# Patient Record
Sex: Male | Born: 1990 | Race: White | Hispanic: No | Marital: Single | Smoking: Former smoker
Health system: Southern US, Community
[De-identification: ages and names within clinical notes are randomized; demographics above are authoritative.]

## PROBLEM LIST (undated history)

## (undated) DIAGNOSIS — B20 Human immunodeficiency virus [HIV] disease: Secondary | ICD-10-CM

## (undated) DIAGNOSIS — N529 Male erectile dysfunction, unspecified: Secondary | ICD-10-CM

## (undated) DIAGNOSIS — G4700 Insomnia, unspecified: Secondary | ICD-10-CM

## (undated) DIAGNOSIS — F32A Depression, unspecified: Secondary | ICD-10-CM

## (undated) HISTORY — DX: Insomnia, unspecified: G47.00

## (undated) HISTORY — DX: Human immunodeficiency virus (HIV) disease: B20

## (undated) HISTORY — DX: Male erectile dysfunction, unspecified: N52.9

## (undated) HISTORY — DX: Depression, unspecified: F32.A

---
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
## 2009-06-17 ENCOUNTER — Ambulatory Visit: Payer: Self-pay | Admitting: Psychiatry

## 2009-06-17 ENCOUNTER — Observation Stay (HOSPITAL_COMMUNITY): Admission: AD | Admit: 2009-06-17 | Discharge: 2009-06-19 | Payer: Self-pay | Admitting: Psychiatry

## 2009-06-17 ENCOUNTER — Emergency Department (HOSPITAL_COMMUNITY): Admission: EM | Admit: 2009-06-17 | Discharge: 2009-06-17 | Payer: Self-pay | Admitting: Emergency Medicine

## 2009-09-17 ENCOUNTER — Emergency Department (HOSPITAL_COMMUNITY): Admission: EM | Admit: 2009-09-17 | Discharge: 2009-09-17 | Payer: Self-pay | Admitting: Emergency Medicine

## 2010-06-16 ENCOUNTER — Emergency Department (HOSPITAL_COMMUNITY): Admission: EM | Admit: 2010-06-16 | Discharge: 2010-06-16 | Payer: Self-pay | Admitting: Emergency Medicine

## 2010-11-02 LAB — RAPID URINE DRUG SCREEN, HOSP PERFORMED
Amphetamines: NOT DETECTED
Barbiturates: NOT DETECTED
Benzodiazepines: NOT DETECTED
Cocaine: NOT DETECTED
Opiates: NOT DETECTED
Tetrahydrocannabinol: POSITIVE — AB

## 2010-11-02 LAB — CBC
HCT: 42.1 % (ref 39.0–52.0)
Platelets: 247 10*3/uL (ref 150–400)
WBC: 12.2 10*3/uL — ABNORMAL HIGH (ref 4.0–10.5)

## 2010-11-02 LAB — BASIC METABOLIC PANEL
BUN: 5 mg/dL — ABNORMAL LOW (ref 6–23)
Calcium: 9.2 mg/dL (ref 8.4–10.5)
GFR calc non Af Amer: >60 mL/min (ref >60)
Potassium: 3.7 mEq/L (ref 3.5–5.1)

## 2010-11-02 LAB — DIFFERENTIAL
Eosinophils Relative: 3 % (ref 0–5)
Lymphocytes Relative: 15 % (ref 12–46)
Lymphs Abs: 1.9 10*3/uL (ref 0.7–4.0)
Neutro Abs: 8.9 10*3/uL — ABNORMAL HIGH (ref 1.7–7.7)
Neutrophils Relative %: 73 % (ref 43–77)

## 2010-11-02 LAB — ETHANOL: Alcohol, Ethyl (B): <5 mg/dL (ref 0–10)
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
## 2013-11-16 ENCOUNTER — Encounter (HOSPITAL_COMMUNITY): Payer: Self-pay | Admitting: Emergency Medicine

## 2013-11-16 ENCOUNTER — Emergency Department (HOSPITAL_COMMUNITY)
Admission: EM | Admit: 2013-11-16 | Discharge: 2013-11-16 | Disposition: A | Discharge status: Home / Self Care | Payer: No Typology Code available for payment source | Attending: Emergency Medicine | Admitting: Emergency Medicine

## 2013-11-16 DIAGNOSIS — S0100XA Unspecified open wound of scalp, initial encounter: Secondary | ICD-10-CM | POA: Insufficient documentation

## 2013-11-16 DIAGNOSIS — S0101XA Laceration without foreign body of scalp, initial encounter: Secondary | ICD-10-CM

## 2013-11-16 DIAGNOSIS — F1010 Alcohol abuse, uncomplicated: Secondary | ICD-10-CM | POA: Insufficient documentation

## 2013-11-16 NOTE — ED Notes (Signed)
Patient head bandaged with sterile guaze wrap.
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
## 2013-11-16 NOTE — ED Notes (Signed)
Bed: RESA Expected date:  Expected time:  Means of arrival:  Comments: Patient coming with [employer]- head lac

## 2013-11-16 NOTE — Discharge Instructions (Signed)
The laceration has been repaired with derma bond and steri strip. The strip will fall off on it's own. Please read the information below on wound care.  Tissue Adhesive Wound Care Some cuts, wounds, lacerations, and incisions can be repaired by using tissue adhesive. Tissue adhesive is like glue. It holds the skin together, allowing for faster healing. It forms a strong bond on the skin in about 1 minute and reaches its full strength in about 2 or 3 minutes. The adhesive disappears naturally while the wound is healing. It is important to take proper care of your wound at home while it heals.  HOME CARE INSTRUCTIONS   Showers are allowed. Do not soak the area containing the tissue adhesive. Do not take baths, swim, or use hot tubs. Do not use any soaps or ointments on the wound. Certain ointments can weaken the glue.  If a bandage (dressing) has been applied, follow your health care provider's instructions for how often to change the dressing.   Keep the dressing dry if one has been applied.   Do not scratch, pick, or rub the adhesive.   Do not place tape over the adhesive. The adhesive could come off when pulling the tape off.   Protect the wound from further injury until it is healed.   Protect the wound from sun and tanning bed exposure while it is healing and for several weeks after healing.   Only take over-the-counter or prescription medicines as directed by your health care provider.   Keep all follow-up appointments as directed by your health care provider. SEEK IMMEDIATE MEDICAL CARE IF:   Your wound becomes red, swollen, hot, or tender.   You develop a rash after the glue is applied.  You have increasing pain in the wound.   You have a red streak that goes away from the wound.   You have pus coming from the wound.   You have increased bleeding.  You have a fever.  You have shaking chills.   You notice a bad smell coming from the wound.   Your wound  or adhesive breaks open.  MAKE SURE YOU:   Understand these instructions.  Will watch your condition.  Will get help right away if you are not doing well or get worse. Document Released: 01/10/2001 Document Revised: 05/07/2013 Document Reviewed: 02/05/2013 [facility] Patient Information 2014 [place], [place].
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
## 2013-11-16 NOTE — ED Notes (Signed)
Discharge instructions reviewed with [employer], requested them to provide these to nurse at jail upon arrival.

## 2019-02-15 ENCOUNTER — Other Ambulatory Visit: Payer: Self-pay | Admitting: *Deleted

## 2019-02-15 DIAGNOSIS — Z20822 Contact with and (suspected) exposure to covid-19: Secondary | ICD-10-CM

## 2019-02-19 LAB — NOVEL CORONAVIRUS, NAA: SARS-CoV-2, NAA: NOT DETECTED

## 2019-06-10 ENCOUNTER — Emergency Department
Admission: EM | Admit: 2019-06-10 | Discharge: 2019-06-10 | Discharge status: Absent without leave | Payer: Self-pay | Source: Home / Self Care

## 2019-06-10 ENCOUNTER — Other Ambulatory Visit: Payer: Self-pay

## 2020-03-21 ENCOUNTER — Emergency Department (HOSPITAL_COMMUNITY)
Admission: EM | Admit: 2020-03-21 | Discharge: 2020-03-21 | Disposition: A | Discharge status: Home / Self Care | Payer: 59 | Attending: Emergency Medicine | Admitting: Emergency Medicine

## 2020-03-21 ENCOUNTER — Other Ambulatory Visit: Payer: Self-pay

## 2020-03-21 ENCOUNTER — Encounter (HOSPITAL_COMMUNITY): Payer: Self-pay | Admitting: Emergency Medicine

## 2020-03-21 DIAGNOSIS — R05 Cough: Secondary | ICD-10-CM | POA: Diagnosis present

## 2020-03-21 DIAGNOSIS — Z20822 Contact with and (suspected) exposure to covid-19: Secondary | ICD-10-CM

## 2020-03-21 DIAGNOSIS — U071 COVID-19: Secondary | ICD-10-CM | POA: Insufficient documentation

## 2020-03-21 LAB — SARS CORONAVIRUS 2 BY RT PCR (HOSPITAL ORDER, PERFORMED IN ~~LOC~~ HOSPITAL LAB): SARS Coronavirus 2: POSITIVE — AB

## 2020-03-21 NOTE — ED Provider Notes (Signed)
[facility]-EMERGENCY DEPT Provider Note   CSN: [id number] Arrival date & time: 03/21/20  1809     History Chief Complaint  Patient presents with  . Covid positive  . wants housing referral    MABEL UNREIN is a 29 y.o. male.  HPI   Patient is a 29 year old male who has history of HIV disease but he is well controlled.  Viral count has been undetectable.  Patient states his most recent CD4 counts have been normal.  Patient has been compliant with medications.  Patient resides in a sober living house.  He states his roommate is on dialysis.  Patient started having some nasal congestion, slight cough and altered taste.  Patient has been vaccinated for Covid but he did a at home Covid test last evening.  Patient states it was positive.  He heard about the state housing program for patients with Covid.  He is requesting a housing referral.  History reviewed. No pertinent past medical history.  There are no problems to display for this patient.   History reviewed. No pertinent surgical history.     No family history on file.  Social History   Tobacco Use  . Smoking status: Never Smoker  Substance Use Topics  . Alcohol use: Yes  . Drug use: Yes    Types: Marijuana    Home Medications Prior to Admission medications   Medication Sig Start Date End Date Taking? Authorizing Provider  acetaminophen (TYLENOL) 500 MG tablet Take 500-1,000 mg by mouth every 6 (six) hours as needed (for headache.).    [provider]    Allergies    Patient has no known allergies.  Review of Systems   Review of Systems  All other systems reviewed and are negative.   Physical Exam Updated Vital Signs BP (!) 155/98 (BP Location: Left Arm)   Pulse 78   Temp 99.2 F (37.3 C) (Oral)   Resp 18   Ht 2.057 m (6\' 9" )   Wt 104.3 kg   SpO2 100%   BMI 24.65 kg/m   Physical Exam Vitals and nursing note reviewed.  Constitutional:      General: He is not in acute  distress.    Appearance: He is well-developed.  HENT:     Head: Normocephalic and atraumatic.     Right Ear: External ear normal.     Left Ear: External ear normal.  Eyes:     General: No scleral icterus.       Right eye: No discharge.        Left eye: No discharge.     Conjunctiva/sclera: Conjunctivae normal.  Neck:     Trachea: No tracheal deviation.  Cardiovascular:     Rate and Rhythm: Normal rate and regular rhythm.  Pulmonary:     Effort: Pulmonary effort is normal. No respiratory distress.     Breath sounds: Normal breath sounds. No stridor. No wheezing or rales.  Abdominal:     General: Bowel sounds are normal. There is no distension.     Palpations: Abdomen is soft.     Tenderness: There is no abdominal tenderness. There is no guarding or rebound.  Musculoskeletal:        General: No tenderness.     Cervical back: Neck supple.  Skin:    General: Skin is warm and dry.     Findings: No rash.  Neurological:     Mental Status: He is alert.     Cranial Nerves: No  cranial nerve deficit (no facial droop, extraocular movements intact, no slurred speech).     Sensory: No sensory deficit.     Motor: No abnormal muscle tone or seizure activity.     Coordination: Coordination normal.     ED Results / Procedures / Treatments   Labs (all labs ordered are listed, but only abnormal results are displayed) Labs Reviewed  SARS CORONAVIRUS 2 BY RT PCR (HOSPITAL ORDER, PERFORMED IN [facility])    EKG None  Radiology No results found.  Procedures Procedures (including critical care time)  Medications Ordered in ED Medications - No data to display  ED Course  I have reviewed the triage vital signs and the nursing notes.  Pertinent labs & imaging results that were available during my care of the patient were reviewed by me and considered in my medical decision making (see chart for details).    MDM Rules/Calculators/A&P                          Case  management was consulted when the patient was in the ED.  They were unable to assist with any housing this evening.  Recommended to isolate.  His COVID-19 test will be back later this evening.  I instructed him to check his mychart Final Clinical Impression(s) / ED Diagnoses Final diagnoses:  Suspected COVID-19 virus infection    Rx / DC Orders ED Discharge Orders    None       Linwood Dibbles, MD 03/21/20 2227

## 2020-03-21 NOTE — ED Triage Notes (Signed)
Pt reports that he lives in sober living home with others. Reports that took a home Covid test last night and was positive. Reports that here to get referral for Covid housing so can quarantine away from others esp with health issues.

## 2020-03-21 NOTE — Discharge Instructions (Signed)
I contacted the case manager tonight but they are unable to help with placement tonight.

## 2021-12-19 ENCOUNTER — Encounter: Payer: Self-pay | Admitting: Family

## 2021-12-19 ENCOUNTER — Ambulatory Visit: Payer: 59

## 2021-12-19 ENCOUNTER — Other Ambulatory Visit: Payer: Self-pay

## 2021-12-19 ENCOUNTER — Other Ambulatory Visit (HOSPITAL_COMMUNITY): Payer: Self-pay

## 2021-12-19 ENCOUNTER — Ambulatory Visit (INDEPENDENT_AMBULATORY_CARE_PROVIDER_SITE_OTHER): Payer: 59 | Admitting: Family

## 2021-12-19 ENCOUNTER — Ambulatory Visit (INDEPENDENT_AMBULATORY_CARE_PROVIDER_SITE_OTHER): Payer: 59 | Admitting: Pharmacist

## 2021-12-19 ENCOUNTER — Telehealth: Payer: Self-pay

## 2021-12-19 VITALS — BP 125/86 | HR 89 | Temp 97.8°F | Ht >= 80 in | Wt 293.0 lb

## 2021-12-19 DIAGNOSIS — Z23 Encounter for immunization: Secondary | ICD-10-CM | POA: Diagnosis not present

## 2021-12-19 DIAGNOSIS — G4709 Other insomnia: Secondary | ICD-10-CM | POA: Diagnosis not present

## 2021-12-19 DIAGNOSIS — Z0000 Encounter for general adult medical examination without abnormal findings: Secondary | ICD-10-CM

## 2021-12-19 DIAGNOSIS — B20 Human immunodeficiency virus [HIV] disease: Secondary | ICD-10-CM

## 2021-12-19 DIAGNOSIS — N529 Male erectile dysfunction, unspecified: Secondary | ICD-10-CM | POA: Diagnosis not present

## 2021-12-19 DIAGNOSIS — F3341 Major depressive disorder, recurrent, in partial remission: Secondary | ICD-10-CM | POA: Diagnosis not present

## 2021-12-19 DIAGNOSIS — Z113 Encounter for screening for infections with a predominantly sexual mode of transmission: Secondary | ICD-10-CM

## 2021-12-19 DIAGNOSIS — Z79899 Other long term (current) drug therapy: Secondary | ICD-10-CM

## 2021-12-19 MED ORDER — BICTEGRAVIR-EMTRICITAB-TENOFOV 50-200-25 MG PO TABS
1.0000 | ORAL_TABLET | Freq: Every day | ORAL | 6 refills | Status: DC
  Filled 2021-12-19 (×2): qty 30, 30d supply, fill #0
  Filled 2022-01-06: qty 30, 30d supply, fill #1
  Filled 2022-02-03: qty 30, 30d supply, fill #2
  Filled 2022-02-23: qty 30, 30d supply, fill #3
  Filled 2022-03-31: qty 30, 30d supply, fill #4

## 2021-12-19 MED ORDER — TRAZODONE HCL 100 MG PO TABS
ORAL_TABLET | ORAL | 5 refills | Status: DC
  Filled 2021-12-19: qty 30, 30d supply, fill #0
  Filled 2021-12-19: qty 30, fill #0
  Filled 2022-01-06: qty 30, 30d supply, fill #1
  Filled 2022-02-03: qty 30, 30d supply, fill #2
  Filled 2022-02-23: qty 30, 30d supply, fill #3
  Filled 2022-03-19: qty 30, 30d supply, fill #4
  Filled 2022-04-17: qty 30, 30d supply, fill #5

## 2021-12-19 MED ORDER — BUPROPION HCL ER (XL) 150 MG PO TB24
ORAL_TABLET | ORAL | 5 refills | Status: DC
  Filled 2021-12-19: qty 30, fill #0
  Filled 2021-12-19: qty 30, 30d supply, fill #0
  Filled 2022-01-06: qty 30, 30d supply, fill #1
  Filled 2022-01-23: qty 30, 30d supply, fill #2

## 2021-12-19 NOTE — Assessment & Plan Note (Signed)
Mr. [name] has chronic insomnia that is improved with nightly trazodone.  No adverse side effects.  Continue current dose of trazodone.

## 2021-12-19 NOTE — Assessment & Plan Note (Signed)
   Discussed importance of safe sexual practices and condom use.  Condoms offered.  Menveo updated.  Recommended routine dental care and can refer to [facility] if needed.

## 2021-12-19 NOTE — Assessment & Plan Note (Signed)
Mr. [name] is a 31 year old Caucasian male with HIV disease diagnosed in May 2021 with initial viral load of 668,000 and CD4 count of 27.  Previous history of PCP pneumonia.  Has been well controlled on Biktarvy and continues to take his medication with good adherence and tolerance.  Reviewed previous lab work and discussed plan of care.  Check blood work today.  Continue current dose of Biktarvy.  Plan for follow-up in 4 months or sooner if needed with lab work 1 to 2 weeks prior to appointment.

## 2021-12-19 NOTE — Progress Notes (Signed)
Brief Narrative   Patient ID: MILON DETHLOFF, male    DOB: 07-20-1991, 31 y.o.   MRN: [id number]  Mr. Hilligoss is a 31 y/o caucasian male diagnosed with HIV diease in May 2021 with risk factor of heterosexual contact. Initial CD4 count of 27 and viral load of 668,000. Initial Genotype with K103N (R-Efavirenz and neviripine). Entered care at [facility] Stage 3. OI history of PCP at diagnosis. Sole medication regimen of Biktarvy.   Subjective:    Chief Complaint  Patient presents with   New Patient (Initial Visit)    Transferring [facility]   HIV Positive/AIDS    HPI:  DARIN ARNDT is a 31 y.o. male with HIV disease, insomnia, and erectile dysfunction presenting today to transfer care for HIV disease from [facility].  Mr. Huntsberry was initially diagnosed with HIV disease in May 2021 when admitted to the hospital and found to have PCP. Initial viral load was 668,000 and CD4 count 27. Risk factor is heterosexual contact. Genotype with K103N medication resistance. Started on [place] for ART treatment. Mr. Mah was last seen at [facility] on 06/01/21 with well controlled virus and good adherence and tolerance to his Biktarvy. Viral load was 60 with CD4 count 521. Kidney function, liver function and electrolytes within normal ranges.  Mr. Dahlstrom continues to take his Biktarvy daily as prescribed with no adverse side effects. Feeling well today. Continues with same male partner who is HIV negative and on PrEP. Denies fevers, chills, night sweats, headaches, changes in vision, neck pain/stiffness, nausea, diarrhea, vomiting, lesions or rashes.  Mr. Maya has no problems obtaining medication from the pharmacy and now covered by [employer]. Has had issues with depression in the past which has been adequately managed with Wellbutrin which he is not currently taking. Has also been taking Trazodone which helps him to sleep. Previously prescribed sildenafil for erectile dysfunction which he takes as needed.  Healthcare maintenance due includes Menveo and Prevnar 20.    No Known Allergies    Outpatient Medications Prior to Visit  Medication Sig Dispense Refill   acetaminophen (TYLENOL) 500 MG tablet Take 500-1,000 mg by mouth every 6 (six) hours as needed (for headache.).     sildenafil (VIAGRA) 100 MG tablet Take one tablet (100 mg dose) by mouth as needed for Erectile Dysfunction. May start out at 1/2 tab as needed 30-60 min before sexual activity.     albuterol (VENTOLIN HFA) 108 (90 Base) MCG/ACT inhaler Inhale 2 puffs every 6 (six) hours if needed for wheezing.     bictegravir-emtricitabine-tenofovir AF (BIKTARVY) 50-200-25 MG TABS tablet Take 1 tablet by mouth daily.     buPROPion (WELLBUTRIN XL) 150 MG 24 hr tablet Take one tablet (150 mg dose) by mouth every morning.     cetirizine (ZYRTEC) 10 MG tablet Take 1 tablet (10 mg total) by mouth 1 (one) time each day.     traZODone (DESYREL) 100 MG tablet Take one tablet (100 mg dose) by mouth at bedtime.     valACYclovir (VALTREX) 1000 MG tablet 1 tab po bid X 3 days     No facility-administered medications prior to visit.     Past Medical History:  Diagnosis Date   Depression    Erectile dysfunction    HIV disease ([place])    Insomnia      History reviewed. No pertinent surgical history.    Review of Systems  Constitutional:  Negative for appetite change, chills, fatigue, fever and unexpected  weight change.  Eyes:  Negative for visual disturbance.  Respiratory:  Negative for cough, chest tightness, shortness of breath and wheezing.   Cardiovascular:  Negative for chest pain and leg swelling.  Gastrointestinal:  Negative for abdominal pain, constipation, diarrhea, nausea and vomiting.  Genitourinary:  Negative for dysuria, flank pain, frequency, genital sores, hematuria and urgency.  Skin:  Negative for rash.  Allergic/Immunologic: Negative for immunocompromised state.  Neurological:  Negative for dizziness and headaches.      Objective:    BP 125/86   Pulse 89   Temp 97.8 F (36.6 C) (Oral)   Ht 6' 10"  (2.083 m)   Wt 293 lb (132.9 kg)   BMI 30.64 kg/m  Nursing note and vital signs reviewed.  Physical Exam Constitutional:      General: He is not in acute distress.    Appearance: He is well-developed.  Eyes:     Conjunctiva/sclera: Conjunctivae normal.  Cardiovascular:     Rate and Rhythm: Normal rate and regular rhythm.     Heart sounds: Normal heart sounds. No murmur heard.   No friction rub. No gallop.  Pulmonary:     Effort: Pulmonary effort is normal. No respiratory distress.     Breath sounds: Normal breath sounds. No wheezing or rales.  Chest:     Chest wall: No tenderness.  Abdominal:     General: Bowel sounds are normal.     Palpations: Abdomen is soft.     Tenderness: There is no abdominal tenderness.  Musculoskeletal:     Cervical back: Neck supple.  Lymphadenopathy:     Cervical: No cervical adenopathy.  Skin:    General: Skin is warm and dry.     Findings: No rash.  Neurological:     Mental Status: He is alert and oriented to person, place, and time.  Psychiatric:        Behavior: Behavior normal.        Thought Content: Thought content normal.        Judgment: Judgment normal.        12/19/2021    9:15 AM  Depression screen PHQ 2/9  Decreased Interest 0  Down, Depressed, Hopeless 0  PHQ - 2 Score 0       Assessment & Plan:    Patient Active Problem List   Diagnosis Date Noted   HIV disease ([place]) 12/19/2021   Other insomnia 12/19/2021   Recurrent major depressive disorder, in partial remission ([place]) 12/19/2021   Erectile dysfunction 12/19/2021   Healthcare maintenance 12/19/2021     Problem List Items Addressed This Visit       Other   HIV disease ([place]) - Primary    Mr. Darl Householder is a 31 year old Caucasian male with HIV disease diagnosed in May 2021 with initial viral load of 668,000 and CD4 count of 27.  Previous history of PCP pneumonia.  Has been well  controlled on Biktarvy and continues to take his medication with good adherence and tolerance.  Reviewed previous lab work and discussed plan of care.  Check blood work today.  Continue current dose of Biktarvy.  Plan for follow-up in 4 months or sooner if needed with lab work 1 to 2 weeks prior to appointment.       Relevant Medications   bictegravir-emtricitabine-tenofovir AF (BIKTARVY) 50-200-25 MG TABS tablet   Other Relevant Orders   Comprehensive metabolic panel   CBC with Differential/Platelet   HIV-1 RNA quant-no reflex-bld   T-helper cell (CD4)- ([facility] clinic only)  HLA B*5701   QuantiFERON-TB Gold Plus   HIV-1 RNA quant-no reflex-bld   T-helper cell (CD4)- ([facility] clinic only)   HLA B*5701   Other insomnia    Mr. Darl Householder has chronic insomnia that is improved with nightly trazodone.  No adverse side effects.  Continue current dose of trazodone.       Relevant Medications   traZODone (DESYREL) 100 MG tablet   Recurrent major depressive disorder, in partial remission ([place])    Previously on Wellbutrin for depression with symptoms currently in partial remission and no suicidal ideations or signs of psychosis.  Counseling discussed.  Continue current dose of Wellbutrin.       Relevant Medications   traZODone (DESYREL) 100 MG tablet   buPROPion (WELLBUTRIN XL) 150 MG 24 hr tablet   Erectile dysfunction    Mr. Darl Householder continues to take sildenafil for erectile dysfunction as needed.  No adverse side effects.  Reminded not to take medication with nitrates for chest pain and to seek medical attention for erection lasting longer than 4 hours.  Continue to monitor.       Healthcare maintenance    Discussed importance of safe sexual practices and condom use.  Condoms offered. Menveo updated. Recommended routine dental care and can refer to [facility] if needed.       Other Visit Diagnoses     Pharmacologic therapy       Relevant Orders   Lipid Profile   Need for pneumococcal 20-valent  conjugate vaccination       Relevant Orders   Pneumococcal conjugate vaccine 20-valent (HMCNOBS-96) (Completed)   Screening for STDs (sexually transmitted diseases)       Relevant Orders   RPR        I have discontinued Jaylin G. Sugarman's albuterol, cetirizine, and valACYclovir. I am also having him maintain his acetaminophen, sildenafil, bictegravir-emtricitabine-tenofovir AF, traZODone, and buPROPion.   Meds ordered this encounter  Medications   bictegravir-emtricitabine-tenofovir AF (BIKTARVY) 50-200-25 MG TABS tablet    Sig: Take 1 tablet by mouth daily.    Dispense:  30 tablet    Refill:  6    Order Specific Question:   Supervising Provider    Answer:   Baxter Flattery, CYNTHIA [[id number]]   traZODone (DESYREL) 100 MG tablet    Sig: Take one tablet (100 mg dose) by mouth at bedtime.    Dispense:  30 tablet    Refill:  5    Order Specific Question:   Supervising Provider    Answer:   Baxter Flattery, CYNTHIA [[id number]]   buPROPion (WELLBUTRIN XL) 150 MG 24 hr tablet    Sig: Take one tablet (150 mg dose) by mouth every morning.    Dispense:  30 tablet    Refill:  5    Order Specific Question:   Supervising Provider    Answer:   Carlyle Basques [[id number]]     Follow-up: Return in about 4 months (around 04/21/2022), or if symptoms worsen or fail to improve.   Terri Piedra, MSN, FNP-C Nurse Practitioner [facility] for Infectious Disease [place] number: [phone]

## 2021-12-19 NOTE — Progress Notes (Signed)
Met with patient to discuss clinic pharmacy services. He is taking Biktarvy without any problems or issues. He has 4 pills left and will pick up his refill today from [facility]. No other questions or issues. Will call if he ever needs anything.  Anvita Hirata L. Eber Hong, PharmD, BCIDP, AAHIVP, CPP Clinical Pharmacist Practitioner Infectious Diseases [place] for Infectious Disease 12/19/2021, 11:09 AM

## 2021-12-19 NOTE — Telephone Encounter (Addendum)
[facility] Patient Product/process development scientist completed.    The patient is insured through [employer].  Biktarvy $073.71 patient will need a copay card  [place] $0.00   Biktarvy copay card     We will continue to follow to see if copay assistance is needed.  Clearance Coots, CPhT Specialty Pharmacy Patient [facility] for Infectious Disease Phone: [phone] Fax:  [phone]

## 2021-12-19 NOTE — Assessment & Plan Note (Signed)
Mr. [name] continues to take sildenafil for erectile dysfunction as needed.  No adverse side effects.  Reminded not to take medication with nitrates for chest pain and to seek medical attention for erection lasting longer than 4 hours.  Continue to monitor.

## 2021-12-19 NOTE — Assessment & Plan Note (Signed)
Previously on Wellbutrin for depression with symptoms currently in partial remission and no suicidal ideations or signs of psychosis.  Counseling discussed.  Continue current dose of Wellbutrin.

## 2021-12-19 NOTE — Patient Instructions (Addendum)
Nice to see you.  We will check your lab work today.  Continue to take your medication daily as prescribed.  Refills have been sent to the pharmacy.  Plan for follow up in 4 months or sooner if needed with lab work 1-2 weeks prior to appointment.    Have a great day and stay safe!  

## 2021-12-20 LAB — T-HELPER CELL (CD4) - (RCID CLINIC ONLY)
CD4 % Helper T Cell: 22 % — ABNORMAL LOW (ref 33–65)
CD4 T Cell Abs: 472 /uL (ref 400–1790)

## 2021-12-23 LAB — LIPID PANEL
Cholesterol: 179 mg/dL (ref <200)
HDL: 37 mg/dL — ABNORMAL LOW (ref >=40)
LDL Cholesterol (Calc): 97 mg/dL (calc)
Non-HDL Cholesterol (Calc): 142 mg/dL (calc) — ABNORMAL HIGH (ref <130)
Total CHOL/HDL Ratio: 4.8 (calc) (ref <5.0)
Triglycerides: 337 mg/dL — ABNORMAL HIGH (ref <150)

## 2021-12-23 LAB — COMPREHENSIVE METABOLIC PANEL
AG Ratio: 1.5 (calc) (ref 1.0–2.5)
ALT: 25 U/L (ref 9–46)
AST: 15 U/L (ref 10–40)
Albumin: 4.5 g/dL (ref 3.6–5.1)
Alkaline phosphatase (APISO): 78 U/L (ref 36–130)
BUN: 14 mg/dL (ref 7–25)
CO2: 25 mmol/L (ref 20–32)
Calcium: 9.9 mg/dL (ref 8.6–10.3)
Chloride: 106 mmol/L (ref 98–110)
Creat: 0.97 mg/dL (ref 0.60–1.26)
Globulin: 3.1 g/dL (calc) (ref 1.9–3.7)
Glucose, Bld: 101 mg/dL — ABNORMAL HIGH (ref 65–99)
Potassium: 4.5 mmol/L (ref 3.5–5.3)
Sodium: 139 mmol/L (ref 135–146)
Total Bilirubin: 0.4 mg/dL (ref 0.2–1.2)
Total Protein: 7.6 g/dL (ref 6.1–8.1)

## 2021-12-23 LAB — CBC WITH DIFFERENTIAL/PLATELET
Absolute Monocytes: 667 cells/uL (ref 200–950)
Basophils Absolute: 71 cells/uL (ref 0–200)
Basophils Relative: 1 %
Eosinophils Absolute: 241 cells/uL (ref 15–500)
Eosinophils Relative: 3.4 %
HCT: 49.1 % (ref 38.5–50.0)
Hemoglobin: 16.7 g/dL (ref 13.2–17.1)
Lymphs Abs: 2116 cells/uL (ref 850–3900)
MCH: 31.2 pg (ref 27.0–33.0)
MCHC: 34 g/dL (ref 32.0–36.0)
MCV: 91.6 fL (ref 80.0–100.0)
MPV: 10.7 fL (ref 7.5–12.5)
Monocytes Relative: 9.4 %
Neutro Abs: 4004 cells/uL (ref 1500–7800)
Neutrophils Relative %: 56.4 %
Platelets: 322 10*3/uL (ref 140–400)
RBC: 5.36 10*6/uL (ref 4.20–5.80)
RDW: 13.2 % (ref 11.0–15.0)
Total Lymphocyte: 29.8 %
WBC: 7.1 10*3/uL (ref 3.8–10.8)

## 2021-12-23 LAB — QUANTIFERON-TB GOLD PLUS
Mitogen-NIL: >10 IU/mL
NIL: 0.05 IU/mL
QuantiFERON-TB Gold Plus: NEGATIVE
TB1-NIL: <0 IU/mL
TB2-NIL: 0 IU/mL

## 2021-12-23 LAB — HIV-1 RNA QUANT-NO REFLEX-BLD
HIV 1 RNA Quant: NOT DETECTED copies/mL
HIV-1 RNA Quant, Log: NOT DETECTED Log copies/mL

## 2022-01-02 ENCOUNTER — Encounter: Payer: Self-pay | Admitting: Family

## 2022-01-06 ENCOUNTER — Other Ambulatory Visit (HOSPITAL_COMMUNITY): Payer: Self-pay

## 2022-01-09 ENCOUNTER — Other Ambulatory Visit (HOSPITAL_COMMUNITY): Payer: Self-pay

## 2022-01-23 ENCOUNTER — Encounter (INDEPENDENT_AMBULATORY_CARE_PROVIDER_SITE_OTHER): Payer: 59

## 2022-01-23 ENCOUNTER — Other Ambulatory Visit (HOSPITAL_COMMUNITY): Payer: Self-pay

## 2022-01-23 DIAGNOSIS — Z79899 Other long term (current) drug therapy: Secondary | ICD-10-CM | POA: Diagnosis not present

## 2022-01-25 ENCOUNTER — Other Ambulatory Visit (HOSPITAL_COMMUNITY): Payer: Self-pay

## 2022-01-25 MED ORDER — ONDANSETRON 4 MG PO TBDP
4.0000 mg | ORAL_TABLET | Freq: Three times a day (TID) | ORAL | 0 refills | Status: DC | PRN
  Filled 2022-01-25: qty 20, 7d supply, fill #0

## 2022-02-03 ENCOUNTER — Other Ambulatory Visit (HOSPITAL_COMMUNITY): Payer: Self-pay

## 2022-02-14 ENCOUNTER — Other Ambulatory Visit (HOSPITAL_COMMUNITY): Payer: Self-pay

## 2022-02-21 ENCOUNTER — Other Ambulatory Visit (HOSPITAL_COMMUNITY): Payer: Self-pay

## 2022-02-21 MED ORDER — BUPROPION HCL ER (XL) 300 MG PO TB24
300.0000 mg | ORAL_TABLET | Freq: Every day | ORAL | 3 refills | Status: DC
  Filled 2022-02-21: qty 30, 30d supply, fill #0
  Filled 2022-03-19: qty 30, 30d supply, fill #1
  Filled 2022-04-18: qty 30, 30d supply, fill #2
  Filled 2022-06-01: qty 30, 30d supply, fill #3

## 2022-02-21 NOTE — Addendum Note (Signed)
Addended by: Jeanine Luz D on: 02/21/2022 02:40 PM   Modules accepted: Orders

## 2022-02-23 ENCOUNTER — Other Ambulatory Visit (HOSPITAL_COMMUNITY): Payer: Self-pay

## 2022-02-28 ENCOUNTER — Other Ambulatory Visit (HOSPITAL_COMMUNITY): Payer: Self-pay

## 2022-03-01 ENCOUNTER — Other Ambulatory Visit (HOSPITAL_COMMUNITY): Payer: Self-pay

## 2022-03-01 MED ORDER — ALBUTEROL SULFATE HFA 108 (90 BASE) MCG/ACT IN AERS
2.0000 | INHALATION_SPRAY | Freq: Four times a day (QID) | RESPIRATORY_TRACT | 1 refills | Status: DC | PRN
  Filled 2022-03-01: qty 18, 25d supply, fill #0

## 2022-03-01 NOTE — Addendum Note (Signed)
Addended by: Jeanine Luz D on: 03/01/2022 02:00 PM   Modules accepted: Orders

## 2022-03-01 NOTE — Telephone Encounter (Signed)
Please see the MyChart message reply(ies) for my assessment and plan.    This patient gave consent for this Medical Advice Message and is aware that it may result in a bill to [employer], as well as the possibility of receiving a bill for a co-payment or deductible. They are an established patient, but are not seeking medical advice exclusively about a problem treated during an in person or video visit in the last seven days. I did not recommend an in person or video visit within seven days of my reply.    I spent a total of 8 minutes cumulative time within 7 days through [employer].  Jeanine Luz, FNP

## 2022-03-03 ENCOUNTER — Other Ambulatory Visit (HOSPITAL_BASED_OUTPATIENT_CLINIC_OR_DEPARTMENT_OTHER): Payer: Self-pay

## 2022-03-19 ENCOUNTER — Other Ambulatory Visit: Payer: Self-pay | Admitting: Family

## 2022-03-20 ENCOUNTER — Other Ambulatory Visit (HOSPITAL_COMMUNITY): Payer: Self-pay

## 2022-03-20 MED ORDER — ONDANSETRON 4 MG PO TBDP
4.0000 mg | ORAL_TABLET | Freq: Three times a day (TID) | ORAL | 0 refills | Status: DC | PRN
  Filled 2022-03-20: qty 20, 7d supply, fill #0

## 2022-03-20 NOTE — Telephone Encounter (Signed)
MyChart message sent to see if patient still needs.   Sandie Ano, RN

## 2022-03-21 ENCOUNTER — Other Ambulatory Visit (HOSPITAL_COMMUNITY): Payer: Self-pay

## 2022-03-22 ENCOUNTER — Other Ambulatory Visit (HOSPITAL_COMMUNITY): Payer: Self-pay

## 2022-03-24 ENCOUNTER — Other Ambulatory Visit (HOSPITAL_COMMUNITY): Payer: Self-pay

## 2022-03-31 ENCOUNTER — Other Ambulatory Visit (HOSPITAL_COMMUNITY): Payer: Self-pay

## 2022-04-04 ENCOUNTER — Other Ambulatory Visit (HOSPITAL_COMMUNITY): Payer: Self-pay

## 2022-04-11 ENCOUNTER — Ambulatory Visit: Payer: Self-pay | Admitting: Family

## 2022-04-11 NOTE — Progress Notes (Deleted)
    Brief Narrative   Patient ID: [name], male    DOB: 01/21/1991, 31 y.o.   MRN: [id number]  [name] is a 31 y/o caucasian male diagnosed with HIV diease in May 2021 with risk factor of heterosexual contact. Initial CD4 count of 27 and viral load of 668,000. Initial Genotype with K103N (R-Efavirenz and neviripine). Entered care at [facility] Stage 3. OI history of PCP at diagnosis. Sole medication regimen of Biktarvy.   Subjective:    No chief complaint on file.   HPI:  [name] is a 31 y.o. male with HIV disease last seen on 12/19/2021 with well-controlled virus and good adherence and tolerance to [employer].  Viral load was undetectable with CD4 count of 472.  Kidney function, liver function, electrolytes within normal ranges.  Lipid profile with triglycerides 337, LDL 97, and HDL 37.  Here today for routine follow-up.     No Known Allergies    Outpatient Medications Prior to Visit  Medication Sig Dispense Refill   acetaminophen (TYLENOL) 500 MG tablet Take 500-1,000 mg by mouth every 6 (six) hours as needed (for headache.).     albuterol (VENTOLIN HFA) 108 (90 Base) MCG/ACT inhaler Inhale 2 puffs into the lungs every 6 (six) hours as needed for wheezing or shortness of breath. 18 g 1   bictegravir-emtricitabine-tenofovir AF (BIKTARVY) 50-200-25 MG TABS tablet Take 1 tablet by mouth daily. 30 tablet 6   buPROPion (WELLBUTRIN XL) 300 MG 24 hr tablet Take 1 tablet by mouth daily. 30 tablet 3   ondansetron (ZOFRAN-ODT) 4 MG disintegrating tablet Dissolve 1 tablet by mouth every 8 hours as needed for nausea or vomiting. 20 tablet 0   sildenafil (VIAGRA) 100 MG tablet Take one tablet (100 mg dose) by mouth as needed for Erectile Dysfunction. May start out at 1/2 tab as needed 30-60 min before sexual activity.     traZODone (DESYREL) 100 MG tablet Take one tablet (100 mg dose) by mouth at bedtime. 30 tablet 5   No facility-administered medications prior to visit.     Past Medical  History:  Diagnosis Date   Depression    Erectile dysfunction    HIV disease (HCC)    Insomnia      No past surgical history on file.    Review of Systems    Objective:    There were no vitals taken for this visit. Nursing note and vital signs reviewed.  Physical Exam      12/19/2021    9:15 AM  Depression screen PHQ 2/9  Decreased Interest 0  Down, Depressed, Hopeless 0  PHQ - 2 Score 0       Assessment & Plan:    Patient Active Problem List   Diagnosis Date Noted   HIV disease (HCC) 12/19/2021   Other insomnia 12/19/2021   Recurrent major depressive disorder, in partial remission (HCC) 12/19/2021   Erectile dysfunction 12/19/2021   Healthcare maintenance 12/19/2021     Problem List Items Addressed This Visit   None    I am having [name] maintain his acetaminophen, sildenafil, bictegravir-emtricitabine-tenofovir AF, traZODone, buPROPion, albuterol, and ondansetron.   No orders of the defined types were placed in this encounter.    Follow-up: No follow-ups on file.   Marcos Eke, MSN, FNP-C Nurse Practitioner [facility] for Infectious Disease [facility] Main number: [phone]

## 2022-04-17 ENCOUNTER — Other Ambulatory Visit (HOSPITAL_COMMUNITY): Payer: Self-pay

## 2022-04-18 ENCOUNTER — Other Ambulatory Visit (HOSPITAL_COMMUNITY): Payer: Self-pay

## 2022-04-19 ENCOUNTER — Other Ambulatory Visit (HOSPITAL_COMMUNITY): Payer: Self-pay

## 2022-04-28 NOTE — Progress Notes (Deleted)
Brief Narrative   Patient ID: [name], male    DOB: 18-Feb-1991, 31 y.o.   MRN: [id number]  Mr. [name] is a 31 y/o caucasian male diagnosed with HIV diease in May 2021 with risk factor of heterosexual contact. Initial CD4 count of 27 and viral load of 668,000. Initial Genotype with K103N (R-Efavirenz and neviripine). Entered care at [facility] Stage 3. OI history of PCP at diagnosis. Sole medication regimen of Biktarvy.   Subjective:    No chief complaint on file.   HPI:  [name] is a 31 y.o. male with HIV/AIDS last seen on 12/19/2021 with well-controlled virus and good adherence and tolerance to [employer].  Viral load was undetectable with CD4 count of 472.  Kidney function, liver function, electrolytes within normal ranges.  Lipid profile with triglycerides 337, LDL 97, and HDL 37.  Here today for follow-up.   No Known Allergies    Outpatient Medications Prior to Visit  Medication Sig Dispense Refill   acetaminophen (TYLENOL) 500 MG tablet Take 500-1,000 mg by mouth every 6 (six) hours as needed (for headache.).     albuterol (VENTOLIN HFA) 108 (90 Base) MCG/ACT inhaler Inhale 2 puffs into the lungs every 6 (six) hours as needed for wheezing or shortness of breath. 18 g 1   bictegravir-emtricitabine-tenofovir AF (BIKTARVY) 50-200-25 MG TABS tablet Take 1 tablet by mouth daily. 30 tablet 6   buPROPion (WELLBUTRIN XL) 300 MG 24 hr tablet Take 1 tablet by mouth daily. 30 tablet 3   ondansetron (ZOFRAN-ODT) 4 MG disintegrating tablet Dissolve 1 tablet by mouth every 8 hours as needed for nausea or vomiting. 20 tablet 0   sildenafil (VIAGRA) 100 MG tablet Take one tablet (100 mg dose) by mouth as needed for Erectile Dysfunction. May start out at 1/2 tab as needed 30-60 min before sexual activity.     traZODone (DESYREL) 100 MG tablet Take one tablet (100 mg dose) by mouth at bedtime. 30 tablet 5   No facility-administered medications prior to visit.     Past Medical History:  Diagnosis  Date   Depression    Erectile dysfunction    HIV disease (HCC)    Insomnia      No past surgical history on file.    Review of Systems  Constitutional:  Negative for appetite change, chills, fatigue, fever and unexpected weight change.  Eyes:  Negative for visual disturbance.  Respiratory:  Negative for cough, chest tightness, shortness of breath and wheezing.   Cardiovascular:  Negative for chest pain and leg swelling.  Gastrointestinal:  Negative for abdominal pain, constipation, diarrhea, nausea and vomiting.  Genitourinary:  Negative for dysuria, flank pain, frequency, genital sores, hematuria and urgency.  Skin:  Negative for rash.  Allergic/Immunologic: Negative for immunocompromised state.  Neurological:  Negative for dizziness and headaches.      Objective:    There were no vitals taken for this visit. Nursing note and vital signs reviewed.  Physical Exam Constitutional:      General: He is not in acute distress.    Appearance: He is well-developed.  Eyes:     Conjunctiva/sclera: Conjunctivae normal.  Cardiovascular:     Rate and Rhythm: Normal rate and regular rhythm.     Heart sounds: Normal heart sounds. No murmur heard.    No friction rub. No gallop.  Pulmonary:     Effort: Pulmonary effort is normal. No respiratory distress.     Breath sounds: Normal breath sounds. No wheezing  or rales.  Chest:     Chest wall: No tenderness.  Abdominal:     General: Bowel sounds are normal.     Palpations: Abdomen is soft.     Tenderness: There is no abdominal tenderness.  Musculoskeletal:     Cervical back: Neck supple.  Lymphadenopathy:     Cervical: No cervical adenopathy.  Skin:    General: Skin is warm and dry.     Findings: No rash.  Neurological:     Mental Status: He is alert and oriented to person, place, and time.  Psychiatric:        Behavior: Behavior normal.        Thought Content: Thought content normal.        Judgment: Judgment normal.          12/19/2021    9:15 AM  Depression screen PHQ 2/9  Decreased Interest 0  Down, Depressed, Hopeless 0  PHQ - 2 Score 0       Assessment & Plan:    Patient Active Problem List   Diagnosis Date Noted   HIV disease ([place]) 12/19/2021   Other insomnia 12/19/2021   Recurrent major depressive disorder, in partial remission ([place]) 12/19/2021   Erectile dysfunction 12/19/2021   Healthcare maintenance 12/19/2021     Problem List Items Addressed This Visit   None    I am having [name] maintain his acetaminophen, sildenafil, bictegravir-emtricitabine-tenofovir AF, traZODone, buPROPion, albuterol, and ondansetron.   No orders of the defined types were placed in this encounter.    Follow-up: No follow-ups on file.   Terri Piedra, MSN, FNP-C Nurse Practitioner [facility] [place] number: [phone]

## 2022-05-01 ENCOUNTER — Ambulatory Visit: Payer: Self-pay | Admitting: Family

## 2022-05-04 ENCOUNTER — Encounter: Payer: Self-pay | Admitting: Family

## 2022-05-04 ENCOUNTER — Other Ambulatory Visit (HOSPITAL_COMMUNITY): Payer: Self-pay

## 2022-05-04 ENCOUNTER — Ambulatory Visit (INDEPENDENT_AMBULATORY_CARE_PROVIDER_SITE_OTHER): Payer: Commercial Managed Care - HMO | Admitting: Family

## 2022-05-04 ENCOUNTER — Other Ambulatory Visit: Payer: Self-pay

## 2022-05-04 VITALS — BP 123/81 | HR 103 | Temp 97.5°F | Ht >= 80 in | Wt 277.0 lb

## 2022-05-04 DIAGNOSIS — B20 Human immunodeficiency virus [HIV] disease: Secondary | ICD-10-CM | POA: Diagnosis not present

## 2022-05-04 DIAGNOSIS — Z0000 Encounter for general adult medical examination without abnormal findings: Secondary | ICD-10-CM | POA: Diagnosis not present

## 2022-05-04 DIAGNOSIS — G629 Polyneuropathy, unspecified: Secondary | ICD-10-CM | POA: Diagnosis not present

## 2022-05-04 DIAGNOSIS — Z23 Encounter for immunization: Secondary | ICD-10-CM | POA: Diagnosis not present

## 2022-05-04 DIAGNOSIS — N529 Male erectile dysfunction, unspecified: Secondary | ICD-10-CM | POA: Diagnosis not present

## 2022-05-04 MED ORDER — BICTEGRAVIR-EMTRICITAB-TENOFOV 50-200-25 MG PO TABS
1.0000 | ORAL_TABLET | Freq: Every day | ORAL | 6 refills | Status: DC
  Filled 2022-05-04 – 2022-05-22 (×4): qty 30, 30d supply, fill #0

## 2022-05-04 MED ORDER — SILDENAFIL CITRATE 100 MG PO TABS: ORAL_TABLET | ORAL | 1 refills | Status: DC

## 2022-05-04 NOTE — Patient Instructions (Addendum)
Nice to see you.  We will check your lab work today.  Continue to take your medication daily as prescribed.  Refills have been sent to the pharmacy.  Plan for follow up in 6 months or sooner if needed with lab work on the same day.  Have a great day and stay safe!  Supplements that may help with neuropathy alpha-lipoic acid and benfotiamine.

## 2022-05-04 NOTE — Progress Notes (Signed)
Brief Narrative   Patient ID: [name], male    DOB: 06-13-1991, 31 y.o.   MRN: [id number]  [name] is a 31 y/o caucasian male diagnosed with HIV diease in May 2021 with risk factor of heterosexual contact. Initial CD4 count of 27 and viral load of 668,000. Initial Genotype with K103N (R-Efavirenz and neviripine). Entered care at [facility] Stage 3. OI history of PCP at diagnosis. Sole medication regimen of Biktarvy.   Subjective:    Chief Complaint  Patient presents with   Follow-up    HPI:  [name] is a 31 y.o. male with HIV disease last seen on 12/19/21 with well controlled virus and good adherence and tolerance to Biktarvy. Viral load was undetectable and CD4 count 472. Quantiferon Gold negative. Kidney function, liver function and electrolytes within normal ranges. Lipid profile with triglycerides 337, LDL 97 and HDL 37. Here today for follow up.   [name] has been doing well since his last office visit and is currently working as a Freight forwarder at [employer].  Has had some numbness/tingling of his bilateral feet on occasion that started since his diagnosis.  Has been taking Biktarvy as prescribed with no adverse side effects.  Using sildenafil for erectile dysfunction.  Feeling well today having lost some weight recently.  Condoms and STD testing offered.  Healthcare maintenance to include influenza vaccination and routine dental care.  Denies fevers, chills, night sweats, headaches, changes in vision, neck pain/stiffness, nausea, diarrhea, vomiting, lesions or rashes.   No Known Allergies    Outpatient Medications Prior to Visit  Medication Sig Dispense Refill   acetaminophen (TYLENOL) 500 MG tablet Take 500-1,000 mg by mouth every 6 (six) hours as needed (for headache.).     albuterol (VENTOLIN HFA) 108 (90 Base) MCG/ACT inhaler Inhale 2 puffs into the lungs every 6 (six) hours as needed for wheezing or shortness of breath. 18 g 1   buPROPion (WELLBUTRIN XL) 300 MG 24 hr tablet Take 1  tablet by mouth daily. 30 tablet 3   ondansetron (ZOFRAN-ODT) 4 MG disintegrating tablet Dissolve 1 tablet by mouth every 8 hours as needed for nausea or vomiting. 20 tablet 0   traZODone (DESYREL) 100 MG tablet Take one tablet (100 mg dose) by mouth at bedtime. 30 tablet 5   bictegravir-emtricitabine-tenofovir AF (BIKTARVY) 50-200-25 MG TABS tablet Take 1 tablet by mouth daily. 30 tablet 6   sildenafil (VIAGRA) 100 MG tablet Take one tablet (100 mg dose) by mouth as needed for Erectile Dysfunction. May start out at 1/2 tab as needed 30-60 min before sexual activity.     No facility-administered medications prior to visit.     Past Medical History:  Diagnosis Date   Depression    Erectile dysfunction    HIV disease ([place])    Insomnia      No past surgical history on file.    Review of Systems  Constitutional:  Negative for appetite change, chills, fatigue, fever and unexpected weight change.  Eyes:  Negative for visual disturbance.  Respiratory:  Negative for cough, chest tightness, shortness of breath and wheezing.   Cardiovascular:  Negative for chest pain and leg swelling.  Gastrointestinal:  Negative for abdominal pain, constipation, diarrhea, nausea and vomiting.  Genitourinary:  Negative for dysuria, flank pain, frequency, genital sores, hematuria and urgency.  Skin:  Negative for rash.  Allergic/Immunologic: Negative for immunocompromised state.  Neurological:  Negative for dizziness and headaches.      Objective:  BP 123/81   Pulse (!) 103   Temp (!) 97.5 F (36.4 C) (Oral)   Ht 6\' 9"  (2.057 m)   Wt 277 lb (125.6 kg)   SpO2 97%   BMI 29.68 kg/m  Nursing note and vital signs reviewed.  Physical Exam Constitutional:      General: He is not in acute distress.    Appearance: He is well-developed.  Eyes:     Conjunctiva/sclera: Conjunctivae normal.  Cardiovascular:     Rate and Rhythm: Normal rate and regular rhythm.     Heart sounds: Normal heart sounds.  No murmur heard.    No friction rub. No gallop.  Pulmonary:     Effort: Pulmonary effort is normal. No respiratory distress.     Breath sounds: Normal breath sounds. No wheezing or rales.  Chest:     Chest wall: No tenderness.  Abdominal:     General: Bowel sounds are normal.     Palpations: Abdomen is soft.     Tenderness: There is no abdominal tenderness.  Musculoskeletal:     Cervical back: Neck supple.  Lymphadenopathy:     Cervical: No cervical adenopathy.  Skin:    General: Skin is warm and dry.     Findings: No rash.  Neurological:     Mental Status: He is alert and oriented to person, place, and time.  Psychiatric:        Behavior: Behavior normal.        Thought Content: Thought content normal.        Judgment: Judgment normal.         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 Check A1c and B12.  Treat with alphalipoic acid and benfotiamine which are supplements available over the counter. Well suppressed with BIktarvy.       Relevant Orders   HgB A1c   B12     Other   HIV disease ([place]) - Primary    [name] continues to have well controlled virus and good adherence and tolerance to [employer]. Reviewed previous lab work and discussed plan of care. 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 Check  lab work today. Continue current dose of Biktarvy. Plan for follow up in 6 months or sooner if needed with lab work on the same day.       Relevant Medications   bictegravir-emtricitabine-tenofovir AF (BIKTARVY) 50-200-25 MG TABS tablet   Other Relevant Orders   Comprehensive metabolic panel   HIV-1 RNA quant-no reflex-bld   T-helper cell (CD4)- ([facility] clinic only)   Erectile dysfunction    [name] continues to use sildenafil as needed for erectile dysfunction with no adverse side effects. Reminded not to  take with nitrates for chest pain and seek further care for erection lasting 4 hours. Continue current dose of sildenafil as needed.       Healthcare maintenance    Discussed importance of safe sexual practice and condom use. Condoms and STD testing offered.  Influenza vaccine updated. Due for routine dental care - refer to dental clinic.         I am having [name] maintain his acetaminophen, traZODone, buPROPion, albuterol, ondansetron, bictegravir-emtricitabine-tenofovir AF, and sildenafil.   Meds ordered this encounter  Medications   bictegravir-emtricitabine-tenofovir AF (BIKTARVY) 50-200-25 MG TABS tablet    Sig: Take 1 tablet by mouth daily.    Dispense:  30 tablet    Refill:  6    Order Specific Question:   Supervising Provider    Answer:   Carlyle Basques [[id number]]   sildenafil (VIAGRA) 100 MG tablet    Sig: Take one tablet (100 mg dose) by mouth as needed for Erectile Dysfunction. May start out at 1/2 tab as needed 30-60 min before sexual activity.    Dispense:  30 tablet    Refill:  1    Order Specific Question:   Supervising Provider    Answer:   Carlyle Basques [[id number]]     Follow-up: Return in about 6 months (around 11/03/2022), or if symptoms worsen or fail to improve.   Terri Piedra, MSN, FNP-C Nurse Practitioner [facility] [place] number: [phone]

## 2022-05-04 NOTE — Assessment & Plan Note (Signed)
Zacary continues to use sildenafil as needed for erectile dysfunction with no adverse side effects. Reminded not to take with nitrates for chest pain and seek further care for erection lasting 4 hours. Continue current dose of sildenafil as needed.

## 2022-05-04 NOTE — Assessment & Plan Note (Signed)
   Discussed importance of safe sexual practice and condom use. Condoms and STD testing offered.   Influenza vaccine updated.  Due for routine dental care - refer to dental clinic.

## 2022-05-04 NOTE — Assessment & Plan Note (Signed)
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 Check A1c and B12.  Treat with alphalipoic acid and benfotiamine which are supplements available over the counter. Well suppressed with BIktarvy.

## 2022-05-04 NOTE — Assessment & Plan Note (Signed)
[name] continues to have well controlled virus and good adherence and tolerance to [employer]. Reviewed previous lab work and discussed plan of care. 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 Check lab work today. Continue current dose of Biktarvy. Plan for follow up in 6 months or sooner if needed with lab work on the same day.

## 2022-05-06 LAB — COMPREHENSIVE METABOLIC PANEL
AG Ratio: 1.5 (calc) (ref 1.0–2.5)
ALT: 23 U/L (ref 9–46)
AST: 17 U/L (ref 10–40)
Albumin: 4.4 g/dL (ref 3.6–5.1)
Alkaline phosphatase (APISO): 72 U/L (ref 36–130)
BUN: 20 mg/dL (ref 7–25)
CO2: 25 mmol/L (ref 20–32)
Calcium: 9.7 mg/dL (ref 8.6–10.3)
Chloride: 107 mmol/L (ref 98–110)
Creat: 1.19 mg/dL (ref 0.60–1.26)
Globulin: 2.9 g/dL (calc) (ref 1.9–3.7)
Glucose, Bld: 91 mg/dL (ref 65–99)
Potassium: 4.6 mmol/L (ref 3.5–5.3)
Sodium: 139 mmol/L (ref 135–146)
Total Bilirubin: 0.4 mg/dL (ref 0.2–1.2)
Total Protein: 7.3 g/dL (ref 6.1–8.1)

## 2022-05-06 LAB — VITAMIN B12: Vitamin B-12: 493 pg/mL (ref 200–1100)

## 2022-05-06 LAB — HEMOGLOBIN A1C
Hgb A1c MFr Bld: 5.1 % of total Hgb (ref <5.7)
Mean Plasma Glucose: 100 mg/dL
eAG (mmol/L): 5.5 mmol/L

## 2022-05-06 LAB — HIV-1 RNA QUANT-NO REFLEX-BLD
HIV 1 RNA Quant: <20 Copies/mL — ABNORMAL HIGH
HIV-1 RNA Quant, Log: <1.3 Log cps/mL — ABNORMAL HIGH

## 2022-05-09 ENCOUNTER — Other Ambulatory Visit (HOSPITAL_COMMUNITY): Payer: Self-pay

## 2022-05-11 ENCOUNTER — Other Ambulatory Visit (HOSPITAL_COMMUNITY): Payer: Self-pay

## 2022-05-15 ENCOUNTER — Other Ambulatory Visit (HOSPITAL_COMMUNITY): Payer: Self-pay

## 2022-05-16 ENCOUNTER — Encounter (INDEPENDENT_AMBULATORY_CARE_PROVIDER_SITE_OTHER): Payer: Commercial Managed Care - HMO

## 2022-05-16 DIAGNOSIS — F419 Anxiety disorder, unspecified: Secondary | ICD-10-CM

## 2022-05-17 ENCOUNTER — Other Ambulatory Visit (HOSPITAL_COMMUNITY): Payer: Self-pay

## 2022-05-17 MED ORDER — BUSPIRONE HCL 10 MG PO TABS
10.0000 mg | ORAL_TABLET | Freq: Two times a day (BID) | ORAL | 1 refills | Status: DC
  Filled 2022-05-17: qty 60, 30d supply, fill #0
  Filled 2022-07-12: qty 60, 30d supply, fill #1

## 2022-05-17 NOTE — Addendum Note (Signed)
Addended by: Mauricio Po D on: 05/17/2022 09:00 AM   Modules accepted: Orders

## 2022-05-17 NOTE — Telephone Encounter (Signed)
Please see the MyChart message reply(ies) for my assessment and plan.    This patient gave consent for this Medical Advice Message and is aware that it may result in a bill to [employer], as well as the possibility of receiving a bill for a co-payment or deductible. They are an established patient, but are not seeking medical advice exclusively about a problem treated during an in person or video visit in the last seven days. I did not recommend an in person or video visit within seven days of my reply.    I spent a total of 10 minutes cumulative time within 7 days through [employer].  Mauricio Po, FNP

## 2022-05-19 ENCOUNTER — Other Ambulatory Visit (HOSPITAL_COMMUNITY): Payer: Self-pay

## 2022-05-22 ENCOUNTER — Other Ambulatory Visit: Payer: Self-pay | Admitting: Family

## 2022-05-22 ENCOUNTER — Other Ambulatory Visit (HOSPITAL_COMMUNITY): Payer: Self-pay

## 2022-05-22 MED ORDER — DOVATO 50-300 MG PO TABS
1.0000 | ORAL_TABLET | Freq: Every day | ORAL | 5 refills | Status: DC
  Filled 2022-05-22 – 2022-05-23 (×2): qty 30, 30d supply, fill #0
  Filled 2022-06-12: qty 30, 30d supply, fill #1
  Filled 2022-07-12: qty 30, 30d supply, fill #2
  Filled 2022-08-18 (×2): qty 30, 30d supply, fill #3
  Filled 2022-09-15: qty 30, 30d supply, fill #4
  Filled 2022-10-13 – 2022-10-27 (×5): qty 30, 30d supply, fill #5

## 2022-05-22 NOTE — Progress Notes (Signed)
Received message that insurance coverage with copay care will no longer cover Biktarvy and will cover Triumeq and Dovato. Prescription sent for Dovato.

## 2022-05-22 NOTE — Telephone Encounter (Signed)
I spoke to him , he will come by tomorrow to get some samples

## 2022-05-22 NOTE — Telephone Encounter (Signed)
I called him this Am and could not leave a VM. I will need W2 to sign up for PAF.

## 2022-05-23 ENCOUNTER — Other Ambulatory Visit (HOSPITAL_COMMUNITY): Payer: Self-pay

## 2022-06-01 ENCOUNTER — Other Ambulatory Visit (HOSPITAL_COMMUNITY): Payer: Self-pay

## 2022-06-01 ENCOUNTER — Other Ambulatory Visit: Payer: Self-pay | Admitting: Family

## 2022-06-01 DIAGNOSIS — G4709 Other insomnia: Secondary | ICD-10-CM

## 2022-06-01 MED ORDER — BUPROPION HCL ER (XL) 300 MG PO TB24
300.0000 mg | ORAL_TABLET | Freq: Every day | ORAL | 0 refills | Status: DC
  Filled 2022-06-01: qty 90, 90d supply, fill #0

## 2022-06-01 MED ORDER — TRAZODONE HCL 100 MG PO TABS
ORAL_TABLET | ORAL | 2 refills | Status: DC
  Filled 2022-06-01: qty 30, 30d supply, fill #0
  Filled 2022-07-12: qty 30, 30d supply, fill #1
  Filled 2022-08-18: qty 30, 30d supply, fill #2

## 2022-06-02 ENCOUNTER — Other Ambulatory Visit (HOSPITAL_COMMUNITY): Payer: Self-pay

## 2022-06-12 ENCOUNTER — Other Ambulatory Visit (HOSPITAL_COMMUNITY): Payer: Self-pay

## 2022-06-15 ENCOUNTER — Other Ambulatory Visit (HOSPITAL_COMMUNITY): Payer: Self-pay

## 2022-07-11 ENCOUNTER — Other Ambulatory Visit (HOSPITAL_COMMUNITY): Payer: Self-pay

## 2022-07-13 ENCOUNTER — Other Ambulatory Visit: Payer: Self-pay

## 2022-07-13 ENCOUNTER — Other Ambulatory Visit (HOSPITAL_COMMUNITY): Payer: Self-pay

## 2022-07-13 MED ORDER — SILDENAFIL CITRATE 100 MG PO TABS
ORAL_TABLET | ORAL | 1 refills | Status: AC
  Filled 2022-07-13: qty 30, 30d supply, fill #0
  Filled 2022-07-27: qty 10, 30d supply, fill #0
  Filled 2022-07-28: qty 30, 30d supply, fill #0
  Filled 2022-07-28: qty 6, 30d supply, fill #0
  Filled 2022-09-06 – 2022-10-31 (×2): qty 30, 30d supply, fill #1

## 2022-07-25 ENCOUNTER — Other Ambulatory Visit (HOSPITAL_COMMUNITY): Payer: Self-pay

## 2022-07-27 ENCOUNTER — Other Ambulatory Visit (HOSPITAL_COMMUNITY): Payer: Self-pay

## 2022-07-28 ENCOUNTER — Other Ambulatory Visit: Payer: Self-pay

## 2022-07-28 ENCOUNTER — Other Ambulatory Visit (HOSPITAL_COMMUNITY): Payer: Self-pay

## 2022-08-08 ENCOUNTER — Other Ambulatory Visit (HOSPITAL_COMMUNITY): Payer: Self-pay

## 2022-08-10 ENCOUNTER — Other Ambulatory Visit (HOSPITAL_COMMUNITY): Payer: Self-pay

## 2022-08-14 ENCOUNTER — Other Ambulatory Visit: Payer: Self-pay

## 2022-08-18 ENCOUNTER — Other Ambulatory Visit (HOSPITAL_COMMUNITY): Payer: Self-pay

## 2022-09-06 ENCOUNTER — Other Ambulatory Visit: Payer: Self-pay | Admitting: Family

## 2022-09-06 ENCOUNTER — Other Ambulatory Visit (HOSPITAL_COMMUNITY): Payer: Self-pay

## 2022-09-06 ENCOUNTER — Other Ambulatory Visit: Payer: Self-pay

## 2022-09-06 MED ORDER — BUPROPION HCL ER (XL) 300 MG PO TB24
300.0000 mg | ORAL_TABLET | Freq: Every day | ORAL | 1 refills | Status: DC
  Filled 2022-09-06 – 2022-09-18 (×2): qty 90, 90d supply, fill #0
  Filled 2022-10-31: qty 90, 90d supply, fill #1

## 2022-09-06 NOTE — Telephone Encounter (Signed)
Ok to refill 

## 2022-09-07 ENCOUNTER — Other Ambulatory Visit (HOSPITAL_COMMUNITY): Payer: Self-pay

## 2022-09-07 ENCOUNTER — Other Ambulatory Visit: Payer: Self-pay

## 2022-09-13 ENCOUNTER — Other Ambulatory Visit (HOSPITAL_COMMUNITY): Payer: Self-pay

## 2022-09-15 ENCOUNTER — Other Ambulatory Visit (HOSPITAL_COMMUNITY): Payer: Self-pay

## 2022-09-16 ENCOUNTER — Other Ambulatory Visit (HOSPITAL_COMMUNITY): Payer: Self-pay

## 2022-09-18 ENCOUNTER — Other Ambulatory Visit (HOSPITAL_COMMUNITY): Payer: Self-pay

## 2022-09-19 ENCOUNTER — Other Ambulatory Visit (HOSPITAL_COMMUNITY): Payer: Self-pay

## 2022-10-11 ENCOUNTER — Other Ambulatory Visit (HOSPITAL_COMMUNITY): Payer: Self-pay

## 2022-10-13 ENCOUNTER — Other Ambulatory Visit: Payer: Self-pay | Admitting: Family

## 2022-10-13 ENCOUNTER — Other Ambulatory Visit: Payer: Self-pay

## 2022-10-13 ENCOUNTER — Other Ambulatory Visit (HOSPITAL_COMMUNITY): Payer: Self-pay

## 2022-10-13 DIAGNOSIS — G4709 Other insomnia: Secondary | ICD-10-CM

## 2022-10-24 ENCOUNTER — Other Ambulatory Visit (HOSPITAL_COMMUNITY): Payer: Self-pay

## 2022-10-27 ENCOUNTER — Other Ambulatory Visit (HOSPITAL_COMMUNITY): Payer: Self-pay

## 2022-10-31 ENCOUNTER — Other Ambulatory Visit: Payer: Self-pay | Admitting: Family

## 2022-10-31 ENCOUNTER — Other Ambulatory Visit: Payer: Self-pay

## 2022-10-31 ENCOUNTER — Other Ambulatory Visit (HOSPITAL_COMMUNITY): Payer: Self-pay

## 2022-10-31 DIAGNOSIS — G4709 Other insomnia: Secondary | ICD-10-CM

## 2022-10-31 MED ORDER — ONDANSETRON 4 MG PO TBDP
4.0000 mg | ORAL_TABLET | Freq: Three times a day (TID) | ORAL | 0 refills | Status: DC | PRN
  Filled 2022-10-31: qty 20, 7d supply, fill #0

## 2022-10-31 MED ORDER — BUSPIRONE HCL 10 MG PO TABS
10.0000 mg | ORAL_TABLET | Freq: Two times a day (BID) | ORAL | 0 refills | Status: DC
  Filled 2022-10-31: qty 60, 30d supply, fill #0

## 2022-10-31 MED ORDER — TRAZODONE HCL 100 MG PO TABS
100.0000 mg | ORAL_TABLET | Freq: Every evening | ORAL | 0 refills | Status: DC
  Filled 2022-10-31: qty 30, 30d supply, fill #0

## 2022-10-31 NOTE — Telephone Encounter (Signed)
Please advise on refill request

## 2022-10-31 NOTE — Telephone Encounter (Signed)
Okay to refill? 

## 2022-11-03 ENCOUNTER — Other Ambulatory Visit (HOSPITAL_COMMUNITY): Payer: Self-pay

## 2022-11-14 ENCOUNTER — Other Ambulatory Visit (HOSPITAL_COMMUNITY): Payer: Self-pay

## 2022-11-17 ENCOUNTER — Other Ambulatory Visit (HOSPITAL_COMMUNITY): Payer: Self-pay

## 2022-11-17 ENCOUNTER — Other Ambulatory Visit: Payer: Self-pay | Admitting: Family

## 2022-11-17 DIAGNOSIS — B20 Human immunodeficiency virus [HIV] disease: Secondary | ICD-10-CM

## 2022-11-17 NOTE — Telephone Encounter (Signed)
Patient has appointment 4/23

## 2022-11-20 NOTE — Telephone Encounter (Deleted)
Attempted to reach patient again- LM to contact [facility].

## 2022-11-21 ENCOUNTER — Other Ambulatory Visit (HOSPITAL_COMMUNITY): Payer: Self-pay

## 2022-11-21 ENCOUNTER — Ambulatory Visit: Payer: Self-pay | Admitting: Family

## 2022-11-21 MED ORDER — DOVATO 50-300 MG PO TABS
1.0000 | ORAL_TABLET | Freq: Every day | ORAL | 0 refills | Status: DC
  Filled 2022-11-21: qty 30, 30d supply, fill #0

## 2022-12-13 ENCOUNTER — Other Ambulatory Visit: Payer: Self-pay

## 2022-12-13 ENCOUNTER — Encounter: Payer: Self-pay | Admitting: Family

## 2022-12-13 ENCOUNTER — Ambulatory Visit (INDEPENDENT_AMBULATORY_CARE_PROVIDER_SITE_OTHER): Payer: BC Managed Care – PPO | Admitting: Family

## 2022-12-13 ENCOUNTER — Other Ambulatory Visit (HOSPITAL_COMMUNITY): Payer: Self-pay

## 2022-12-13 VITALS — BP 142/86 | HR 91 | Temp 99.1°F | Resp 16 | Wt 285.0 lb

## 2022-12-13 DIAGNOSIS — G4709 Other insomnia: Secondary | ICD-10-CM | POA: Diagnosis not present

## 2022-12-13 DIAGNOSIS — B20 Human immunodeficiency virus [HIV] disease: Secondary | ICD-10-CM | POA: Diagnosis not present

## 2022-12-13 DIAGNOSIS — A549 Gonococcal infection, unspecified: Secondary | ICD-10-CM

## 2022-12-13 DIAGNOSIS — Z23 Encounter for immunization: Secondary | ICD-10-CM | POA: Diagnosis not present

## 2022-12-13 DIAGNOSIS — Z0000 Encounter for general adult medical examination without abnormal findings: Secondary | ICD-10-CM

## 2022-12-13 DIAGNOSIS — Z113 Encounter for screening for infections with a predominantly sexual mode of transmission: Secondary | ICD-10-CM

## 2022-12-13 DIAGNOSIS — F3341 Major depressive disorder, recurrent, in partial remission: Secondary | ICD-10-CM | POA: Diagnosis not present

## 2022-12-13 MED ORDER — BUPROPION HCL ER (XL) 300 MG PO TB24
300.0000 mg | ORAL_TABLET | Freq: Every day | ORAL | 1 refills | Status: AC
  Filled 2022-12-13: qty 90, 90d supply, fill #0

## 2022-12-13 MED ORDER — DOVATO 50-300 MG PO TABS
1.0000 | ORAL_TABLET | Freq: Every day | ORAL | 5 refills | Status: DC
  Filled 2022-12-13 – 2022-12-14 (×2): qty 30, 30d supply, fill #0

## 2022-12-13 MED ORDER — ALBUTEROL SULFATE HFA 108 (90 BASE) MCG/ACT IN AERS
2.0000 | INHALATION_SPRAY | Freq: Four times a day (QID) | RESPIRATORY_TRACT | 1 refills | Status: AC | PRN
  Filled 2022-12-13: qty 6.7, 25d supply, fill #0

## 2022-12-13 MED ORDER — DOXYCYCLINE HYCLATE 100 MG PO TABS
100.0000 mg | ORAL_TABLET | Freq: Two times a day (BID) | ORAL | 0 refills | Status: DC
  Filled 2022-12-13: qty 14, 7d supply, fill #0

## 2022-12-13 MED ORDER — TRAZODONE HCL 100 MG PO TABS
100.0000 mg | ORAL_TABLET | Freq: Every evening | ORAL | 5 refills | Status: DC
  Filled 2022-12-13: qty 30, 30d supply, fill #0
  Filled 2023-04-08 – 2023-05-01 (×2): qty 30, 30d supply, fill #1

## 2022-12-13 MED ORDER — CEFTRIAXONE SODIUM 500 MG IJ SOLR
500.0000 mg | Freq: Once | INTRAMUSCULAR | Status: AC
Start: 2022-12-13 — End: 2022-12-13
  Administered 2022-12-13: 500 mg via INTRAMUSCULAR

## 2022-12-13 MED ORDER — BUSPIRONE HCL 10 MG PO TABS
10.0000 mg | ORAL_TABLET | Freq: Two times a day (BID) | ORAL | 5 refills | Status: AC
  Filled 2022-12-13: qty 60, 30d supply, fill #0

## 2022-12-13 MED ORDER — ONDANSETRON 4 MG PO TBDP
4.0000 mg | ORAL_TABLET | Freq: Three times a day (TID) | ORAL | 1 refills | Status: AC | PRN
  Filled 2022-12-13: qty 20, 7d supply, fill #0

## 2022-12-13 NOTE — Assessment & Plan Note (Signed)
[name] continues to have well controlled virus with good adherence and tolerance to Dovato. Reviewed previous lab work and discussed U=U and plan of care. Check lab work. Update insurance if there is a change from new job. Continue current dose of Dovato. Plan for follow up in 6 months or sooner if needed with lab work on the same day.

## 2022-12-13 NOTE — Progress Notes (Signed)
Brief Narrative   Patient ID: [name], male    DOB: [birth year], 32 y.o.   MRN: [id number]  Mr. Wolfe is a 32 y/o caucasian male diagnosed with HIV diease in May 2021 with risk factor of heterosexual contact. Initial CD4 count of 27 and viral load of 668,000. Initial Genotype with K103N (R-Efavirenz and neviripine). Entered care at [facility] Stage 3. OI history of PCP at diagnosis. Sole medication regimen of Biktarvy.    Subjective:    Chief Complaint  Patient presents with   Follow-up    B20     HPI:  [name] is a 32 y.o. male with HIV disease last seen on 05/04/22 with well controlled virus and good adherence and tolerance to Dovato. Viral load was undetectable and CD4 count 472. Kidney function, liver function, electrolytes within normal ranges. Here today for follow up.  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 Taking Dovato as prescribed with no adverse side effects. Sleeping well with trazodone and mood adequately controlled with buspirone and bupropion. Getting ready to travel for new job and is concerned about STD and is currently without symptoms. Condoms and STD testing offered. Healthcare maintenance due includes Menveo and routine dental care.   Denies fevers, chills, night sweats, headaches, changes in vision, neck pain/stiffness, nausea, diarrhea, vomiting, lesions or rashes.  No Known Allergies    Outpatient Medications Prior to Visit  Medication Sig Dispense Refill   acetaminophen (TYLENOL) 500 MG tablet Take 500-1,000 mg by mouth every 6 (six) hours as needed (for headache.).     sildenafil (VIAGRA) 100 MG tablet Take one tablet (100 mg dose) by mouth as needed for Erectile Dysfunction. May start out at 1/2 tab as needed 30-60 min before sexual activity. 30 tablet 1   albuterol (VENTOLIN HFA) 108 (90 Base) MCG/ACT inhaler Inhale 2 puffs into the lungs every 6 (six) hours as needed  for wheezing or shortness of breath. 18 g 1   buPROPion (WELLBUTRIN XL) 300 MG 24 hr tablet Take 1 tablet by mouth daily. 90 tablet 1   busPIRone (BUSPAR) 10 MG tablet Take 1 tablet (10 mg total) by mouth 2 (two) times daily. 60 tablet 0   dolutegravir-lamiVUDine (DOVATO) 50-300 MG tablet Take 1 tablet by mouth daily. 30 tablet 0   ondansetron (ZOFRAN-ODT) 4 MG disintegrating tablet Dissolve 1 tablet by mouth every 8 hours as needed for nausea or vomiting. 20 tablet 0   traZODone (DESYREL) 100 MG tablet Take 1 tablet (100 mg total) by mouth at bedtime. 30 tablet 0   No facility-administered medications prior to visit.     Past Medical History:  Diagnosis Date   Depression    Erectile dysfunction    HIV disease (HCC)    Insomnia      No past surgical history on file.    Review of Systems  Constitutional:  Negative for appetite change, chills, fatigue, fever and unexpected weight change.  Eyes:  Negative for visual disturbance.  Respiratory:  Negative for cough, chest tightness, shortness of breath and wheezing.   Cardiovascular:  Negative for chest pain and leg swelling.  Gastrointestinal:  Negative for abdominal pain, constipation, diarrhea, nausea and vomiting.  Genitourinary:  Negative for dysuria, flank pain, frequency, genital sores, hematuria and urgency.  Skin:  Negative for rash.  Allergic/Immunologic: Negative for immunocompromised state.  Neurological:  Negative for dizziness  and headaches.      Objective:    BP (!) 142/86   Pulse 91   Temp 99.1 F (37.3 C) (Oral)   Resp 16   Wt 285 lb (129.3 kg)   SpO2 100%   BMI 30.54 kg/m  Nursing note and vital signs reviewed.  Physical Exam Constitutional:      General: He is not in acute distress.    Appearance: He is well-developed.  Eyes:     Conjunctiva/sclera: Conjunctivae normal.  Cardiovascular:     Rate and Rhythm: Normal rate and regular rhythm.     Heart sounds: Normal heart sounds. No murmur heard.     No friction rub. No gallop.  Pulmonary:     Effort: Pulmonary effort is normal. No respiratory distress.     Breath sounds: Normal breath sounds. No wheezing or rales.  Chest:     Chest wall: No tenderness.  Abdominal:     General: Bowel sounds are normal.     Palpations: Abdomen is soft.     Tenderness: There is no abdominal tenderness.  Musculoskeletal:     Cervical back: Neck supple.  Lymphadenopathy:     Cervical: No cervical adenopathy.  Skin:    General: Skin is warm and dry.     Findings: No rash.  Neurological:     Mental Status: He is alert and oriented to person, place, and time.  Psychiatric:        Behavior: Behavior normal.        Thought Content: Thought content normal.        Judgment: Judgment normal.         12/13/2022    3:52 PM 05/04/2022    1:50 PM 12/19/2021    9:15 AM  Depression screen PHQ 2/9  Decreased Interest 0 0 0  Down, Depressed, Hopeless 0 0 0  PHQ - 2 Score 0 0 0       Assessment & Plan:    Patient Active Problem List   Diagnosis Date Noted   Neuropathy 05/04/2022   HIV disease (HCC) 12/19/2021   Other insomnia 12/19/2021   Recurrent major depressive disorder, in partial remission (HCC) 12/19/2021   Erectile dysfunction 12/19/2021   Healthcare maintenance 12/19/2021     Problem List Items Addressed This Visit       Other   HIV disease (HCC) - Primary    Cyler continues to have well controlled virus with good adherence and tolerance to Dovato. Reviewed previous lab work and discussed U=U and plan of care. Check lab work. Update insurance if there is a change from new job. Continue current dose of Dovato. Plan for follow up in 6 months or sooner if needed with lab work on the same day.       Relevant Medications   dolutegravir-lamiVUDine (DOVATO) 50-300 MG tablet   Other Relevant Orders   COMPLETE METABOLIC PANEL WITH GFR   HIV-1 RNA quant-no reflex-bld   T-helper cell (CD4)- ([facility] only)   Other insomnia    Arshan is  sleeping well with current dose of trazodone and no adverse side effects. Continue current dose of trazodone.       Relevant Medications   traZODone (DESYREL) 100 MG tablet   Recurrent major depressive disorder, in partial remission (HCC)    Cai's mood has been stable with current dose of buspirone and bupropion with no adverse side effects. Continue both buspirone and bupropion.       Relevant Medications  buPROPion (WELLBUTRIN XL) 300 MG 24 hr tablet   busPIRone (BUSPAR) 10 MG tablet   traZODone (DESYREL) 100 MG tablet   Healthcare maintenance    Discussed importance of safe sexual practice and condom use. Condoms and STD testing offered.  Menveo updated today. Routine dental care once insurance changes and can refer to [facility] if needed.  Introduced anal pap for anal cancer screening and will consider at next office visit.  Will preemptively treat for gonorrhea with ceftriaxone and chlamydia with doxycycline.       Other Visit Diagnoses     Screening for STDs (sexually transmitted diseases)       Relevant Orders   RPR   Gonorrhea       Relevant Medications   dolutegravir-lamiVUDine (DOVATO) 50-300 MG tablet   cefTRIAXone (ROCEPHIN) injection 500 mg (Completed)   Need for meningitis vaccination       Relevant Orders   MENINGOCOCCAL MCV4O(MENVEO) (Completed)        I am having Norwin G. Wrisley start on doxycycline. I am also having him maintain his acetaminophen, sildenafil, albuterol, buPROPion, busPIRone, Dovato, ondansetron, and traZODone. We administered cefTRIAXone.   Meds ordered this encounter  Medications   albuterol (VENTOLIN HFA) 108 (90 Base) MCG/ACT inhaler    Sig: Inhale 2 puffs into the lungs every 6 (six) hours as needed for wheezing or shortness of breath.    Dispense:  6.7 g    Refill:  1    Order Specific Question:   Supervising Provider    Answer:   Drue Second, CYNTHIA [[id number]]   buPROPion (WELLBUTRIN XL) 300 MG 24 hr tablet    Sig: Take 1 tablet by mouth  daily.    Dispense:  90 tablet    Refill:  1    Order Specific Question:   Supervising Provider    Answer:   Drue Second, CYNTHIA [[id number]]   busPIRone (BUSPAR) 10 MG tablet    Sig: Take 1 tablet (10 mg total) by mouth 2 (two) times daily.    Dispense:  60 tablet    Refill:  5    Order Specific Question:   Supervising Provider    Answer:   Judyann Munson [[id number]]   dolutegravir-lamiVUDine (DOVATO) 50-300 MG tablet    Sig: Take 1 tablet by mouth daily.    Dispense:  30 tablet    Refill:  5    Order Specific Question:   Supervising Provider    Answer:   Drue Second, CYNTHIA [[id number]]   ondansetron (ZOFRAN-ODT) 4 MG disintegrating tablet    Sig: Dissolve 1 tablet by mouth every 8 hours as needed for nausea or vomiting.    Dispense:  20 tablet    Refill:  1    Order Specific Question:   Supervising Provider    Answer:   Drue Second, CYNTHIA [[id number]]   traZODone (DESYREL) 100 MG tablet    Sig: Take 1 tablet (100 mg total) by mouth at bedtime.    Dispense:  30 tablet    Refill:  5    Order Specific Question:   Supervising Provider    Answer:   Drue Second, CYNTHIA [[id number]]   doxycycline (VIBRA-TABS) 100 MG tablet    Sig: Take 1 tablet (100 mg total) by mouth 2 (two) times daily.    Dispense:  14 tablet    Refill:  0    Order Specific Question:   Supervising Provider    Answer:   Drue Second, CYNTHIA [[id number]]   cefTRIAXone (ROCEPHIN) injection 500 mg  Follow-up: Return in about 6 months (around 06/15/2023), or if symptoms worsen or fail to improve.   Marcos Eke, MSN, FNP-C Nurse Practitioner [facility] for Infectious Disease [facility] Main number: [phone]

## 2022-12-13 NOTE — Assessment & Plan Note (Addendum)
Discussed importance of safe sexual practice and condom use. Condoms and STD testing offered.  Menveo updated today. Routine dental care once insurance changes and can refer to [facility] if needed.  Introduced anal pap for anal cancer screening and will consider at next office visit.  Will preemptively treat for gonorrhea with ceftriaxone and chlamydia with doxycycline.

## 2022-12-13 NOTE — Assessment & Plan Note (Signed)
Cobain's mood has been stable with current dose of buspirone and bupropion with no adverse side effects. Continue both buspirone and bupropion.

## 2022-12-13 NOTE — Patient Instructions (Addendum)
Nice to see you. ? ?We will check your lab work today. ? ?Continue to take your medication daily as prescribed. ? ?Refills have been sent to the pharmacy. ? ?Plan for follow up in 6 months or sooner if needed with lab work on the same day. ? ?Have a great day and stay safe! ? ?

## 2022-12-13 NOTE — Assessment & Plan Note (Signed)
[name] is sleeping well with current dose of trazodone and no adverse side effects. Continue current dose of trazodone.

## 2022-12-14 ENCOUNTER — Telehealth: Payer: Self-pay

## 2022-12-14 ENCOUNTER — Other Ambulatory Visit: Payer: Self-pay | Admitting: Pharmacist

## 2022-12-14 ENCOUNTER — Other Ambulatory Visit (HOSPITAL_COMMUNITY): Payer: Self-pay

## 2022-12-14 DIAGNOSIS — B20 Human immunodeficiency virus [HIV] disease: Secondary | ICD-10-CM

## 2022-12-14 LAB — COMPLETE METABOLIC PANEL WITH GFR
AG Ratio: 1.5 (calc) (ref 1.0–2.5)
Albumin: 4.7 g/dL (ref 3.6–5.1)
Alkaline phosphatase (APISO): 75 U/L (ref 36–130)
BUN: 16 mg/dL (ref 7–25)
Calcium: 9.3 mg/dL (ref 8.6–10.3)
Chloride: 104 mmol/L (ref 98–110)
Creat: 0.96 mg/dL (ref 0.60–1.26)
Total Bilirubin: 0.3 mg/dL (ref 0.2–1.2)
eGFR: 107 mL/min/{1.73_m2} (ref >=60)

## 2022-12-14 LAB — T-HELPER CELL (CD4) - (RCID CLINIC ONLY)
CD4 % Helper T Cell: 24 % — ABNORMAL LOW (ref 33–65)
CD4 T Cell Abs: 526 /uL (ref 400–1790)

## 2022-12-14 MED ORDER — DOVATO 50-300 MG PO TABS
1.0000 | ORAL_TABLET | Freq: Every day | ORAL | 0 refills | Status: DC
Start: 2022-12-14 — End: 2022-12-14

## 2022-12-14 MED ORDER — DOVATO 50-300 MG PO TABS
1.0000 | ORAL_TABLET | Freq: Every day | ORAL | 0 refills | Status: DC
Start: 2022-12-14 — End: 2023-01-05

## 2022-12-14 NOTE — Telephone Encounter (Signed)
[facility] Patient Advocate Encounter  Completed and sent [employer] patient assistance application for Dovato for this patient who is uninsured.    Patient assistance phone number for follow up is [phone].   This encounter will be updated until final determination.,   Clearance Coots, CPhT Specialty Pharmacy Patient [facility] for Infectious Disease Phone: [phone] Fax:  [phone]

## 2022-12-14 NOTE — Progress Notes (Signed)
Medication Samples have been provided to the patient.  Drug name: Dovato        Strength: 50/300 mg         Qty: 28  Tablets (2 bottles) LOT: [id number]   Exp.Date: 4/25  Dosing instructions: Take one tablet by mouth once daily  The patient has been instructed regarding the correct time, dose, and frequency of taking this medication, including desired effects and most common side effects.   Elliannah Wayment, PharmD, CPP, BCIDP, AAHIVP Clinical Pharmacist Practitioner Infectious Diseases Clinical Pharmacist [facility]  

## 2022-12-15 LAB — RPR TITER: RPR Titer: 1:16 {titer} — ABNORMAL HIGH

## 2022-12-16 LAB — HIV-1 RNA QUANT-NO REFLEX-BLD: HIV 1 RNA Quant: 24 Copies/mL — ABNORMAL HIGH

## 2022-12-18 ENCOUNTER — Telehealth: Payer: Self-pay

## 2022-12-18 LAB — T PALLIDUM AB: T Pallidum Abs: POSITIVE — AB

## 2022-12-18 LAB — HIV-1 RNA QUANT-NO REFLEX-BLD: HIV-1 RNA Quant, Log: 1.39 Log cps/mL — ABNORMAL HIGH

## 2022-12-18 LAB — COMPLETE METABOLIC PANEL WITH GFR
ALT: 29 U/L (ref 9–46)
AST: 18 U/L (ref 10–40)
CO2: 25 mmol/L (ref 20–32)
Globulin: 3.1 g/dL (calc) (ref 1.9–3.7)
Glucose, Bld: 91 mg/dL (ref 65–99)
Potassium: 4.7 mmol/L (ref 3.5–5.3)
Sodium: 137 mmol/L (ref 135–146)
Total Protein: 7.8 g/dL (ref 6.1–8.1)

## 2022-12-18 LAB — RPR: RPR Ser Ql: REACTIVE — AB

## 2022-12-18 MED ORDER — DOXYCYCLINE HYCLATE 100 MG PO TABS: 100.0000 mg | ORAL_TABLET | Freq: Two times a day (BID) | ORAL | 0 refills | Status: DC

## 2022-12-18 NOTE — Telephone Encounter (Signed)
Patient aware. Patient is currently out of town for work unable to come in for bicillin injections.   Per Jeanine Luz, NP - send in Doxycycline 100 mg BID x 56 tablets.    RX sent to [facility] in [place], [place]. Patient aware.   Crystin Lechtenberg Lesli Albee, CMA

## 2022-12-18 NOTE — Telephone Encounter (Signed)
-----   Message from Veryl Speak, FNP sent at 12/18/2022  2:40 PM EDT ----- Please call and schedule [name] for syphilis treatment with 3 weekly injections of Bicillin 2.4 million units IM.   Had previous history of 1:16 (treated) -- 1:1 then 1:8 (not treated) -- now 1:16

## 2022-12-21 ENCOUNTER — Other Ambulatory Visit (HOSPITAL_COMMUNITY): Payer: Self-pay

## 2022-12-27 ENCOUNTER — Other Ambulatory Visit (HOSPITAL_COMMUNITY): Payer: Self-pay

## 2022-12-28 ENCOUNTER — Other Ambulatory Visit: Payer: Self-pay

## 2023-01-01 ENCOUNTER — Ambulatory Visit: Payer: BC Managed Care – PPO

## 2023-01-04 ENCOUNTER — Other Ambulatory Visit (HOSPITAL_COMMUNITY): Payer: Self-pay

## 2023-01-05 ENCOUNTER — Telehealth: Payer: Self-pay

## 2023-01-05 ENCOUNTER — Other Ambulatory Visit (HOSPITAL_COMMUNITY): Payer: Self-pay

## 2023-01-05 DIAGNOSIS — B20 Human immunodeficiency virus [HIV] disease: Secondary | ICD-10-CM

## 2023-01-05 MED ORDER — DOVATO 50-300 MG PO TABS
1.0000 | ORAL_TABLET | Freq: Every day | ORAL | 5 refills | Status: DC
Start: 2023-01-05 — End: 2023-09-18

## 2023-01-05 NOTE — Telephone Encounter (Signed)
-----   Message from Bobette Mo, CPhT sent at 01/05/2023 10:06 AM EDT ----- Regarding: Paulita Cradle,  Can you send his Dovato Script to [employer] pt just got approved for UMAP.     Thank You,  Clearance Coots, CPhT Specialty Pharmacy Patient [facility] for Infectious Disease Phone: [phone] Fax: [phone]

## 2023-01-05 NOTE — Telephone Encounter (Signed)
Called Kwame to notify him of pharmacy change, no answer. Left HIPAA compliant voicemail stating MyChart message will be sent and to call with any questions.   Sandie Ano, RN

## 2023-04-09 ENCOUNTER — Other Ambulatory Visit (HOSPITAL_COMMUNITY): Payer: Self-pay

## 2023-04-19 ENCOUNTER — Other Ambulatory Visit (HOSPITAL_COMMUNITY): Payer: Self-pay

## 2023-05-02 ENCOUNTER — Other Ambulatory Visit (HOSPITAL_COMMUNITY): Payer: Self-pay

## 2023-05-16 ENCOUNTER — Other Ambulatory Visit (HOSPITAL_COMMUNITY): Payer: Self-pay

## 2023-08-07 ENCOUNTER — Other Ambulatory Visit (HOSPITAL_COMMUNITY): Payer: Self-pay

## 2023-08-13 ENCOUNTER — Telehealth: Payer: Self-pay

## 2023-08-13 NOTE — Telephone Encounter (Signed)
 Patient called requesting samples of Dovato . Reports he's been out of medication for 3 days. He is in the process of applying for UMAP, but needs to drop off some additional paperwork for Deanna.   Says he can come by today to get samples. He does not have the paperwork with him to drop off. Emphasized importance of getting UMAP application completed since our office cannot supply multiple rounds of samples. Patient verbalized understanding and has no further questions.   Thomas Wiland D Victoriano Campion, RN

## 2023-08-16 ENCOUNTER — Other Ambulatory Visit: Payer: Self-pay | Admitting: Pharmacist

## 2023-08-16 MED ORDER — DOVATO 50-300 MG PO TABS: 1.0000 | ORAL_TABLET | Freq: Every day | ORAL | Status: AC

## 2023-08-16 NOTE — Progress Notes (Signed)
Medication Samples have been provided to the patient.  Drug name: Dovato        Strength: 50/300 mg         Qty: 14  Tablets (1 bottles) LOT: [id number]   Exp.Date: 7/26  Dosing instructions: Take one tablet by mouth once daily  The patient has been instructed regarding the correct time, dose, and frequency of taking this medication, including desired effects and most common side effects.   Margarite Gouge, PharmD, CPP, BCIDP, AAHIVP Clinical Pharmacist Practitioner Infectious Diseases Clinical Pharmacist [facility] for Infectious Disease

## 2023-08-21 ENCOUNTER — Ambulatory Visit: Payer: Self-pay | Admitting: Family

## 2023-08-27 ENCOUNTER — Other Ambulatory Visit: Payer: Self-pay

## 2023-08-27 ENCOUNTER — Other Ambulatory Visit (HOSPITAL_COMMUNITY)
Admission: RE | Admit: 2023-08-27 | Discharge: 2023-08-27 | Disposition: A | Discharge status: Home / Self Care | Payer: Self-pay | Source: Ambulatory Visit | Attending: Family | Admitting: Family

## 2023-08-27 ENCOUNTER — Other Ambulatory Visit (HOSPITAL_COMMUNITY): Payer: Self-pay

## 2023-08-27 DIAGNOSIS — Z113 Encounter for screening for infections with a predominantly sexual mode of transmission: Secondary | ICD-10-CM

## 2023-08-27 DIAGNOSIS — B20 Human immunodeficiency virus [HIV] disease: Secondary | ICD-10-CM | POA: Insufficient documentation

## 2023-08-27 DIAGNOSIS — Z79899 Other long term (current) drug therapy: Secondary | ICD-10-CM

## 2023-08-28 LAB — URINE CYTOLOGY ANCILLARY ONLY
Chlamydia: NEGATIVE
Comment: NEGATIVE
Comment: NORMAL
Neisseria Gonorrhea: NEGATIVE

## 2023-08-28 LAB — T-HELPER CELL (CD4) - (RCID CLINIC ONLY)
CD4 % Helper T Cell: 24 % — ABNORMAL LOW (ref 33–65)
CD4 T Cell Abs: 692 /uL (ref 400–1790)

## 2023-08-30 LAB — COMPLETE METABOLIC PANEL WITH GFR
AG Ratio: 1.5 (calc) (ref 1.0–2.5)
ALT: 30 U/L (ref 9–46)
AST: 20 U/L (ref 10–40)
Albumin: 4.6 g/dL (ref 3.6–5.1)
Alkaline phosphatase (APISO): 87 U/L (ref 36–130)
BUN: 17 mg/dL (ref 7–25)
CO2: 25 mmol/L (ref 20–32)
Calcium: 9.7 mg/dL (ref 8.6–10.3)
Chloride: 106 mmol/L (ref 98–110)
Creat: 1 mg/dL (ref 0.60–1.26)
Globulin: 3 g/dL (ref 1.9–3.7)
Glucose, Bld: 93 mg/dL (ref 65–99)
Potassium: 4.7 mmol/L (ref 3.5–5.3)
Sodium: 140 mmol/L (ref 135–146)
Total Bilirubin: 0.3 mg/dL (ref 0.2–1.2)
Total Protein: 7.6 g/dL (ref 6.1–8.1)
eGFR: 102 mL/min/{1.73_m2} (ref >=60)

## 2023-08-30 LAB — CBC WITH DIFFERENTIAL/PLATELET
Absolute Lymphocytes: 3162 {cells}/uL (ref 850–3900)
Absolute Monocytes: 798 {cells}/uL (ref 200–950)
Basophils Absolute: 68 {cells}/uL (ref 0–200)
Basophils Relative: 0.9 %
Eosinophils Absolute: 289 {cells}/uL (ref 15–500)
Eosinophils Relative: 3.8 %
HCT: 48.2 % (ref 38.5–50.0)
Hemoglobin: 16.3 g/dL (ref 13.2–17.1)
MCH: 32 pg (ref 27.0–33.0)
MCHC: 33.8 g/dL (ref 32.0–36.0)
MCV: 94.5 fL (ref 80.0–100.0)
MPV: 10.9 fL (ref 7.5–12.5)
Monocytes Relative: 10.5 %
Neutro Abs: 3283 {cells}/uL (ref 1500–7800)
Neutrophils Relative %: 43.2 %
Platelets: 352 10*3/uL (ref 140–400)
RBC: 5.1 10*6/uL (ref 4.20–5.80)
RDW: 12.5 % (ref 11.0–15.0)
Total Lymphocyte: 41.6 %
WBC: 7.6 10*3/uL (ref 3.8–10.8)

## 2023-08-30 LAB — T PALLIDUM AB: T Pallidum Abs: POSITIVE — AB

## 2023-08-30 LAB — HIV-1 RNA QUANT-NO REFLEX-BLD
HIV 1 RNA Quant: NOT DETECTED {copies}/mL
HIV-1 RNA Quant, Log: NOT DETECTED {Log}

## 2023-08-30 LAB — LIPID PANEL
Cholesterol: 215 mg/dL — ABNORMAL HIGH (ref <200)
HDL: 37 mg/dL — ABNORMAL LOW (ref >=40)
Non-HDL Cholesterol (Calc): 178 mg/dL — ABNORMAL HIGH (ref <130)
Total CHOL/HDL Ratio: 5.8 (calc) — ABNORMAL HIGH (ref <5.0)
Triglycerides: 659 mg/dL — ABNORMAL HIGH (ref <150)

## 2023-08-30 LAB — RPR TITER: RPR Titer: 1:8 {titer} — ABNORMAL HIGH

## 2023-08-30 LAB — RPR: RPR Ser Ql: REACTIVE — AB

## 2023-08-31 ENCOUNTER — Other Ambulatory Visit: Payer: Self-pay | Admitting: Pharmacist

## 2023-08-31 MED ORDER — DOVATO 50-300 MG PO TABS: 1.0000 | ORAL_TABLET | Freq: Every day | ORAL | Status: AC

## 2023-08-31 NOTE — Progress Notes (Signed)
Medication Samples have been provided to the patient.  Drug name: Biktarvy        Strength: 50/200/25 mg       Qty: 14 tablets (1 bottles) LOT: [id number]   Exp.Date: 7/26  Dosing instructions: Take one tablet by mouth once daily  The patient has been instructed regarding the correct time, dose, and frequency of taking this medication, including desired effects and most common side effects.   Margarite Gouge, PharmD, CPP, BCIDP, AAHIVP Clinical Pharmacist Practitioner Infectious Diseases Clinical Pharmacist [facility] for [facility]ious Disease

## 2023-09-06 ENCOUNTER — Telehealth: Payer: Self-pay

## 2023-09-06 NOTE — Telephone Encounter (Signed)
 Received call today from [facility] stating patient is requesting refill on Dovato . Last filled with them on 05/07/23. Labs done on 08/27/23 VL undetectable. Provided okay to refill to pharmacist.  Julien Odor, RMA

## 2023-09-17 ENCOUNTER — Ambulatory Visit: Payer: Self-pay | Admitting: Family

## 2023-09-18 ENCOUNTER — Other Ambulatory Visit: Payer: Self-pay

## 2023-09-18 ENCOUNTER — Encounter: Payer: Self-pay | Admitting: Family

## 2023-09-18 ENCOUNTER — Telehealth: Payer: Self-pay | Admitting: Family

## 2023-09-18 DIAGNOSIS — G4709 Other insomnia: Secondary | ICD-10-CM

## 2023-09-18 DIAGNOSIS — B20 Human immunodeficiency virus [HIV] disease: Secondary | ICD-10-CM

## 2023-09-18 DIAGNOSIS — Z0000 Encounter for general adult medical examination without abnormal findings: Secondary | ICD-10-CM

## 2023-09-18 DIAGNOSIS — A539 Syphilis, unspecified: Secondary | ICD-10-CM

## 2023-09-18 MED ORDER — DOXYCYCLINE HYCLATE 100 MG PO TABS: 100.0000 mg | ORAL_TABLET | Freq: Two times a day (BID) | ORAL | 0 refills | Status: AC

## 2023-09-18 MED ORDER — TRAZODONE HCL 100 MG PO TABS: 100.0000 mg | ORAL_TABLET | Freq: Every evening | ORAL | 5 refills | Status: DC

## 2023-09-18 MED ORDER — DOVATO 50-300 MG PO TABS: 1.0000 | ORAL_TABLET | Freq: Every day | ORAL | 5 refills | Status: DC

## 2023-09-18 NOTE — Progress Notes (Signed)
Brief Narrative   Patient ID: [name], male    DOB: Feb 05, 1991, 33 y.o.   MRN: [id number]  [name] is a 33 y/o caucasian male diagnosed with HIV diease in May 2021 with risk factor of heterosexual contact. Initial CD4 count of 27 and viral load of 668,000. Initial Genotype with K103N (R-Efavirenz and neviripine). Entered care at [facility] Stage 3. OI history of PCP at diagnosis. Sole medication regimen of Biktarvy.    Subjective:    Chief Complaint  Patient presents with   Follow-up    B20      Virtual Visit via Telephone/Video Note   I connected with [name] on 09/18/2023  at 3:53 PM  by video and verified that I am speaking with the correct person using two identifiers.   I discussed the limitations, risks, security and privacy concerns of performing an evaluation and management service by telephone and the availability of in person appointments. I also discussed with the patient that there may be a patient responsible charge related to this service. The patient expressed understanding and agreed to proceed.  Location:  Patient: Home Provider: [facility]  HPI:  [name] is a 33 y.o. male with HIV disease last seen on 12/13/2022 with well-controlled virus and good adherence and tolerance to Dovato..  Viral load was undetectable with CD4 count 526.  RPR titer was positive at 1: 16.  Most recent lab work completed on 08/27/2023 with viral load that remains undetectable and CD4 count 692.  RPR titer down to 1: 8.  STD testing negative.  Kidney function, liver function, electrolytes within normal ranges.  Lipid profile with triglycerides 659, LDL unable to be calculated and HDL 37.  Telehealth visit today for routine follow-up.  [name] been doing okay since his last office visit and continues to take Dovato as prescribed with no adverse side effects or problems obtaining medication from the pharmacy. Remains covered by Juanell Fairly and UMAP.  Requesting refill of trazodone.  Was  previously prescribed doxycycline for syphilis treatment and did not take it.  Currently living in [place] with housing stable and good access to food.  Condoms and site-specific STD testing offered when coming to the office.  Healthcare maintenance reviewed.  Denies fevers, chills, night sweats, headaches, changes in vision, neck pain/stiffness, nausea, diarrhea, vomiting, lesions or rashes.   Lab Results  Component Value Date   HIV1RNAQUANT Not Detected 08/27/2023   Lab Results  Component Value Date   CD4TCELL 24 (L) 08/27/2023   CD4TABS 692 08/27/2023     No Known Allergies    Outpatient Medications Prior to Visit  Medication Sig Dispense Refill   acetaminophen (TYLENOL) 500 MG tablet Take 500-1,000 mg by mouth every 6 (six) hours as needed (for headache.). (Patient not taking: Reported on 09/18/2023)     albuterol (VENTOLIN HFA) 108 (90 Base) MCG/ACT inhaler Inhale 2 puffs into the lungs every 6 (six) hours as needed for wheezing or shortness of breath. (Patient not taking: Reported on 09/18/2023) 6.7 g 1   buPROPion (WELLBUTRIN XL) 300 MG 24 hr tablet Take 1 tablet by mouth daily. (Patient not taking: Reported on 09/18/2023) 90 tablet 1   busPIRone (BUSPAR) 10 MG tablet Take 1 tablet (10 mg total) by mouth 2 (two) times daily. (Patient not taking: Reported on 09/18/2023) 60 tablet 5   ondansetron (ZOFRAN-ODT) 4 MG disintegrating tablet Dissolve 1 tablet by mouth every 8 hours as needed for nausea or vomiting. (  Patient not taking: Reported on 09/18/2023) 20 tablet 1   sildenafil (VIAGRA) 100 MG tablet Take one tablet (100 mg dose) by mouth as needed for Erectile Dysfunction. May start out at 1/2 tab as needed 30-60 min before sexual activity. (Patient not taking: Reported on 09/18/2023) 30 tablet 1   dolutegravir-lamiVUDine (DOVATO) 50-300 MG tablet Take 1 tablet by mouth daily. (Patient not taking: Reported on 09/18/2023) 30 tablet 5   doxycycline (VIBRA-TABS) 100 MG tablet Take 1 tablet (100  mg total) by mouth 2 (two) times daily. 56 tablet 0   traZODone (DESYREL) 100 MG tablet Take 1 tablet (100 mg total) by mouth at bedtime. (Patient not taking: Reported on 09/18/2023) 30 tablet 5   No facility-administered medications prior to visit.     Past Medical History:  Diagnosis Date   Depression    Erectile dysfunction    HIV disease (HCC)    Insomnia      History reviewed. No pertinent surgical history.    Review of Systems  Constitutional:  Negative for appetite change, chills, fatigue, fever and unexpected weight change.  Eyes:  Negative for visual disturbance.  Respiratory:  Negative for cough, chest tightness, shortness of breath and wheezing.   Cardiovascular:  Negative for chest pain and leg swelling.  Gastrointestinal:  Negative for abdominal pain, constipation, diarrhea, nausea and vomiting.  Genitourinary:  Negative for dysuria, flank pain, frequency, genital sores, hematuria and urgency.  Skin:  Negative for rash.  Allergic/Immunologic: Negative for immunocompromised state.  Neurological:  Negative for dizziness and headaches.      Objective:    There were no vitals taken for this visit. Nursing note and vital signs reviewed.  Physical Exam Constitutional:      General: He is not in acute distress.    Appearance: He is not ill-appearing.  Neurological:     Mental Status: He is alert.  Psychiatric:        Mood and Affect: Mood normal.         09/18/2023    9:57 AM 12/13/2022    3:52 PM 05/04/2022    1:50 PM 12/19/2021    9:15 AM  Depression screen PHQ 2/9  Decreased Interest 0 0 0 0  Down, Depressed, Hopeless 0 0 0 0  PHQ - 2 Score 0 0 0 0       Assessment & Plan:    Patient Active Problem List   Diagnosis Date Noted   Syphilis 09/18/2023   Neuropathy 05/04/2022   HIV disease (HCC) 12/19/2021   Other insomnia 12/19/2021   Recurrent major depressive disorder, in partial remission (HCC) 12/19/2021   Erectile dysfunction 12/19/2021    Healthcare maintenance 12/19/2021     Problem List Items Addressed This Visit       Other   HIV disease [facility])   Mr. Curiale continues to have well-controlled virus with good adherence and tolerance to Dovato.  Reviewed lab work and discussed plan of care and U equals U.  Reminded of importance of taking medication daily and attending routine follow-up appointments to reduce risk of complications and and disease progression.  Continue current dose of Dovato.  Plan for follow-up in 4 months or sooner if needed with lab work 1 to 2 weeks prior to appointment.      Relevant Medications   dolutegravir-lamiVUDine (DOVATO) 50-300 MG tablet   Other insomnia   Previously on trazodone to help with sleep and has been out of medication with worsening sleeping pattern. Restart trazodone.  Relevant Medications   traZODone (DESYREL) 100 MG tablet   Healthcare maintenance   Discussed importance of safe sexual practice and condom use. Condoms and site specific STD testing offered.  Vaccinations reviewed. Recommend routine dental care.       Syphilis - Primary   Previous titer of 1:16 and was prescribed doxycycline which was never taken. Discussed importance of treatment and that syphilis can worsen to neurosyphilis/ocular syphilis. Start doxycyline x 28 days. Recheck RPR follow treatment in 3 months.       Relevant Medications   dolutegravir-lamiVUDine (DOVATO) 50-300 MG tablet     I have discontinued Nivin G. Kamphaus's doxycycline. I am also having him start on doxycycline. Additionally, I am having him maintain his acetaminophen, sildenafil, albuterol, buPROPion, busPIRone, ondansetron, Dovato, and traZODone.   Meds ordered this encounter  Medications   dolutegravir-lamiVUDine (DOVATO) 50-300 MG tablet    Sig: Take 1 tablet by mouth daily.    Dispense:  30 tablet    Refill:  5    Supervising Provider:   Judyann Munson [phone]    Prescription Type::   Renewal   traZODone (DESYREL) 100 MG  tablet    Sig: Take 1 tablet (100 mg total) by mouth at bedtime.    Dispense:  30 tablet    Refill:  5    Supervising Provider:   Judyann Munson [[id number]]   doxycycline (VIBRA-TABS) 100 MG tablet    Sig: Take 1 tablet (100 mg total) by mouth 2 (two) times daily.    Dispense:  56 tablet    Refill:  0    Supervising Provider:   Judyann Munson [phone]     I discussed the assessment and treatment plan with the patient. The patient was provided an opportunity to ask questions and all were answered. The patient agreed with the plan and demonstrated an understanding of the instructions.   The patient was advised to call back or seek an in-person evaluation if the symptoms worsen or if the condition fails to improve as anticipated.   I provided 13  minutes of non-face-to-face time during this encounter.  Follow-up: Return in about 4 months (around 01/16/2024), or if symptoms worsen or fail to improve.   Marcos Eke, MSN, FNP-C Nurse Practitioner [facility] for Infectious Disease [facility] Main number: [phone]

## 2023-09-18 NOTE — Assessment & Plan Note (Signed)
Previously on trazodone to help with sleep and has been out of medication with worsening sleeping pattern. Restart trazodone.

## 2023-09-18 NOTE — Assessment & Plan Note (Signed)
Previous titer of 1:16 and was prescribed doxycycline which was never taken. Discussed importance of treatment and that syphilis can worsen to neurosyphilis/ocular syphilis. Start doxycyline x 28 days. Recheck RPR follow treatment in 3 months.

## 2023-09-18 NOTE — Assessment & Plan Note (Signed)
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  Reviewed lab work and discussed plan of care and U equals U.  Reminded of importance of taking medication daily and attending routine follow-up appointments to reduce risk of complications and and disease progression.  Continue current dose of Dovato.  Plan for follow-up in 4 months or sooner if needed with lab work 1 to 2 weeks prior to appointment.

## 2023-09-18 NOTE — Assessment & Plan Note (Signed)
Discussed importance of safe sexual practice and condom use. Condoms and site specific STD testing offered.  Vaccinations reviewed. Recommend routine dental care.

## 2023-09-18 NOTE — Patient Instructions (Signed)
 Nice to see you.  Continue to take your medication daily as prescribed.  Refills have been sent to the pharmacy.  Plan for follow up in 4 months or sooner if needed with lab work on the same day.  Have a great day and stay safe!

## 2024-01-31 ENCOUNTER — Telehealth: Payer: Self-pay

## 2024-01-31 NOTE — Telephone Encounter (Signed)
 Patient calling requesting a letter stating his diagnosis for his job. He states the letter is extremely important. He states he missed 2 days of work due to nausea. Letter can be sent to his mychart. 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  [name], CMA

## 2024-06-05 ENCOUNTER — Other Ambulatory Visit: Payer: Self-pay | Admitting: Family

## 2024-06-05 DIAGNOSIS — G4709 Other insomnia: Secondary | ICD-10-CM

## 2024-06-05 DIAGNOSIS — B20 Human immunodeficiency virus [HIV] disease: Secondary | ICD-10-CM

## 2024-06-05 NOTE — Telephone Encounter (Signed)
 Please advise regarding trazodone 

## 2024-07-08 ENCOUNTER — Ambulatory Visit: Payer: Self-pay | Admitting: Family

## 2024-08-04 ENCOUNTER — Other Ambulatory Visit: Payer: Self-pay | Admitting: Family

## 2024-08-04 DIAGNOSIS — B20 Human immunodeficiency virus [HIV] disease: Secondary | ICD-10-CM

## 2024-08-05 ENCOUNTER — Ambulatory Visit: Payer: Self-pay

## 2024-08-05 ENCOUNTER — Telehealth: Payer: Self-pay

## 2024-08-05 NOTE — Telephone Encounter (Signed)
 Patient schedule a phone call to renew HMAP but did not answer.

## 2024-08-13 ENCOUNTER — Other Ambulatory Visit: Payer: Self-pay | Admitting: Pharmacist

## 2024-08-13 ENCOUNTER — Telehealth: Payer: Self-pay

## 2024-08-13 DIAGNOSIS — B20 Human immunodeficiency virus [HIV] disease: Secondary | ICD-10-CM

## 2024-08-13 MED ORDER — DOVATO 50-300 MG PO TABS: 1.0000 | ORAL_TABLET | Freq: Every day | ORAL | Status: AC

## 2024-08-13 NOTE — Progress Notes (Signed)
 Medication Samples have been provided to the patient.  Drug name: Dovato         Strength: 50/300 mg         Qty: 1 bottle (14 tablets)   LOT: [id number]   Exp.Date: 02/27/26  Samples requested by Alan Geralds, PharmD.  Dosing instructions: Take one tablet by mouth once daily  The patient has been instructed regarding the correct time, dose, and frequency of taking this medication, including desired effects and most common side effects.   Elba Schaber L. Lexy Meininger, PharmD, BCIDP, AAHIVP, CPP Clinical Pharmacist Practitioner Infectious Diseases Clinical Pharmacist [facility]

## 2024-08-13 NOTE — Telephone Encounter (Signed)
 Patient voice mail has not been set up, needing to rescheduled upcoming appointment with Gregory Calone.

## 2024-08-22 ENCOUNTER — Telehealth: Payer: Self-pay

## 2024-08-22 NOTE — Telephone Encounter (Signed)
 Patient left a messaging asked for me to call him. I called him and he has voicemail back that has not been set up yet. Patient needs to provide pay stubs to complete his financial form.

## 2024-08-27 ENCOUNTER — Ambulatory Visit: Payer: Self-pay | Admitting: Family

## 2024-09-03 ENCOUNTER — Other Ambulatory Visit: Payer: Self-pay | Admitting: Pharmacist

## 2024-09-03 MED ORDER — BICTEGRAVIR-EMTRICITAB-TENOFOV 50-200-25 MG PO TABS: 1.0000 | ORAL_TABLET | Freq: Every day | ORAL | Status: AC

## 2024-09-03 NOTE — Progress Notes (Signed)
 Medication Samples have been provided to the patient.  Drug name: Biktarvy         Strength: 50/200/25 mg       Qty: 14 tablets (2 bottles) LOT: [id number]   Exp.Date: 1/28  Samples requested by Deanna Mabe and Leslee Code. Addressed that patient does take Dovato  daily; however, samples were unavailable for Dovato . Patient will take Biktarvy  daily until Medicaid is activated.   Dosing instructions: Take one tablet by mouth once daily  The patient has been instructed regarding the correct time, dose, and frequency of taking this medication, including desired effects and most common side effects.   Alan Geralds, PharmD, CPP, BCIDP, AAHIVP Clinical Pharmacist Practitioner Infectious Diseases Clinical Pharmacist [facility]

## 2024-09-17 ENCOUNTER — Ambulatory Visit: Payer: Self-pay | Admitting: Family
